# Patient Record
Sex: Female | Born: 1945 | Race: White | Hispanic: No | Smoking: Never smoker
Health system: Southern US, Community
[De-identification: ages and names within clinical notes are randomized; demographics above are authoritative.]

## PROBLEM LIST (undated history)

## (undated) DIAGNOSIS — E039 Hypothyroidism, unspecified: Secondary | ICD-10-CM

## (undated) DIAGNOSIS — M7121 Synovial cyst of popliteal space [Baker], right knee: Secondary | ICD-10-CM

## (undated) DIAGNOSIS — J329 Chronic sinusitis, unspecified: Secondary | ICD-10-CM

## (undated) DIAGNOSIS — E119 Type 2 diabetes mellitus without complications: Secondary | ICD-10-CM

## (undated) DIAGNOSIS — F32A Depression, unspecified: Secondary | ICD-10-CM

## (undated) DIAGNOSIS — R42 Dizziness and giddiness: Secondary | ICD-10-CM

## (undated) DIAGNOSIS — F329 Major depressive disorder, single episode, unspecified: Secondary | ICD-10-CM

## (undated) DIAGNOSIS — G4730 Sleep apnea, unspecified: Secondary | ICD-10-CM

## (undated) DIAGNOSIS — R519 Headache, unspecified: Secondary | ICD-10-CM

## (undated) DIAGNOSIS — M1990 Unspecified osteoarthritis, unspecified site: Secondary | ICD-10-CM

## (undated) DIAGNOSIS — D649 Anemia, unspecified: Secondary | ICD-10-CM

## (undated) DIAGNOSIS — I10 Essential (primary) hypertension: Secondary | ICD-10-CM

## (undated) DIAGNOSIS — R51 Headache: Secondary | ICD-10-CM

## (undated) DIAGNOSIS — E785 Hyperlipidemia, unspecified: Secondary | ICD-10-CM

## (undated) DIAGNOSIS — F419 Anxiety disorder, unspecified: Secondary | ICD-10-CM

## (undated) HISTORY — PX: ABDOMINAL HYSTERECTOMY: SHX81

## (undated) HISTORY — PX: BREAST BIOPSY: SHX20

## (undated) HISTORY — PX: CARDIAC CATHETERIZATION: SHX172

## (undated) HISTORY — PX: CHOLECYSTECTOMY: SHX55

---

## 2003-01-09 HISTORY — PX: JOINT REPLACEMENT: SHX530

## 2004-05-30 ENCOUNTER — Ambulatory Visit: Payer: Self-pay | Admitting: Family Medicine

## 2004-12-11 ENCOUNTER — Ambulatory Visit: Payer: Self-pay | Admitting: Family Medicine

## 2005-06-04 ENCOUNTER — Ambulatory Visit: Payer: Self-pay | Admitting: Unknown Physician Specialty

## 2005-10-06 ENCOUNTER — Emergency Department: Payer: Self-pay | Admitting: Emergency Medicine

## 2006-06-22 ENCOUNTER — Ambulatory Visit: Payer: Self-pay | Admitting: Family Medicine

## 2007-06-02 ENCOUNTER — Ambulatory Visit: Payer: Self-pay | Admitting: Family Medicine

## 2009-02-09 ENCOUNTER — Ambulatory Visit: Payer: Self-pay | Admitting: Family Medicine

## 2010-03-15 ENCOUNTER — Ambulatory Visit: Payer: Self-pay | Admitting: Family Medicine

## 2011-06-12 ENCOUNTER — Ambulatory Visit: Payer: Self-pay | Admitting: Family Medicine

## 2011-06-13 ENCOUNTER — Ambulatory Visit: Payer: Self-pay | Admitting: Family Medicine

## 2012-10-01 ENCOUNTER — Ambulatory Visit: Payer: Self-pay | Admitting: Family Medicine

## 2013-10-30 ENCOUNTER — Ambulatory Visit: Payer: Self-pay | Admitting: Family Medicine

## 2013-11-17 ENCOUNTER — Emergency Department: Payer: Self-pay | Admitting: Emergency Medicine

## 2013-11-17 LAB — CBC WITH DIFFERENTIAL/PLATELET
BASOS ABS: 0.1 10*3/uL (ref 0.0–0.1)
Basophil %: 0.6 %
Eosinophil #: 0 10*3/uL (ref 0.0–0.7)
Eosinophil %: 0.1 %
HCT: 35.4 % (ref 35.0–47.0)
HGB: 11.8 g/dL — AB (ref 12.0–16.0)
LYMPHS PCT: 12.8 %
Lymphocyte #: 1.3 10*3/uL (ref 1.0–3.6)
MCH: 30 pg (ref 26.0–34.0)
MCHC: 33.5 g/dL (ref 32.0–36.0)
MCV: 90 fL (ref 80–100)
MONOS PCT: 7.9 %
Monocyte #: 0.8 x10 3/mm (ref 0.2–0.9)
NEUTROS ABS: 7.9 10*3/uL — AB (ref 1.4–6.5)
NEUTROS PCT: 78.6 %
Platelet: 217 10*3/uL (ref 150–440)
RBC: 3.95 10*6/uL (ref 3.80–5.20)
RDW: 13.6 % (ref 11.5–14.5)
WBC: 10.1 10*3/uL (ref 3.6–11.0)

## 2013-11-17 LAB — URINALYSIS, COMPLETE
BILIRUBIN, UR: NEGATIVE
BLOOD: NEGATIVE
GLUCOSE, UR: NEGATIVE mg/dL (ref 0–75)
Ketone: NEGATIVE
Nitrite: NEGATIVE
PROTEIN: NEGATIVE
Ph: 5 (ref 4.5–8.0)
RBC,UR: 2 /HPF (ref 0–5)
Specific Gravity: 1.011 (ref 1.003–1.030)
WBC UR: 13 /HPF (ref 0–5)

## 2013-11-17 LAB — COMPREHENSIVE METABOLIC PANEL
Albumin: 3.7 g/dL (ref 3.4–5.0)
Alkaline Phosphatase: 131 U/L — ABNORMAL HIGH
Anion Gap: 9 (ref 7–16)
BUN: 14 mg/dL (ref 7–18)
Bilirubin,Total: 2.2 mg/dL — ABNORMAL HIGH (ref 0.2–1.0)
CHLORIDE: 99 mmol/L (ref 98–107)
Calcium, Total: 9.2 mg/dL (ref 8.5–10.1)
Co2: 27 mmol/L (ref 21–32)
Creatinine: 1.06 mg/dL (ref 0.60–1.30)
EGFR (African American): >60
EGFR (Non-African Amer.): 55 — ABNORMAL LOW
GLUCOSE: 134 mg/dL — AB (ref 65–99)
Osmolality: 273 (ref 275–301)
Potassium: 4.1 mmol/L (ref 3.5–5.1)
SGOT(AST): 20 U/L (ref 15–37)
SGPT (ALT): 34 U/L
Sodium: 135 mmol/L — ABNORMAL LOW (ref 136–145)
Total Protein: 8.2 g/dL (ref 6.4–8.2)

## 2013-11-17 LAB — TROPONIN I
Troponin-I: <0.02 ng/mL
Troponin-I: <0.02 ng/mL

## 2013-11-17 LAB — LIPASE, BLOOD: Lipase: 81 U/L (ref 73–393)

## 2014-01-21 ENCOUNTER — Ambulatory Visit: Payer: Self-pay | Admitting: Surgery

## 2014-01-21 LAB — HEPATIC FUNCTION PANEL A (ARMC)
ALT: 20 U/L
Albumin: 3.4 g/dL (ref 3.4–5.0)
Alkaline Phosphatase: 99 U/L
BILIRUBIN TOTAL: 0.6 mg/dL (ref 0.2–1.0)
Bilirubin, Direct: <0.1 mg/dL (ref 0.0–0.2)
SGOT(AST): 20 U/L (ref 15–37)
TOTAL PROTEIN: 6.9 g/dL (ref 6.4–8.2)

## 2014-01-21 LAB — BASIC METABOLIC PANEL
ANION GAP: 6 — AB (ref 7–16)
BUN: 9 mg/dL (ref 7–18)
CALCIUM: 8.3 mg/dL — AB (ref 8.5–10.1)
CO2: 31 mmol/L (ref 21–32)
Chloride: 101 mmol/L (ref 98–107)
Creatinine: 0.89 mg/dL (ref 0.60–1.30)
EGFR (African American): >60
EGFR (Non-African Amer.): >60
Glucose: 165 mg/dL — ABNORMAL HIGH (ref 65–99)
Osmolality: 278 (ref 275–301)
Potassium: 3.8 mmol/L (ref 3.5–5.1)
Sodium: 138 mmol/L (ref 136–145)

## 2014-01-22 ENCOUNTER — Ambulatory Visit: Payer: Self-pay | Admitting: Surgery

## 2014-01-22 LAB — CBC WITH DIFFERENTIAL/PLATELET
BASOS ABS: 0.1 10*3/uL (ref 0.0–0.1)
BASOS PCT: 0.8 %
EOS ABS: 0.2 10*3/uL (ref 0.0–0.7)
Eosinophil %: 2.3 %
HCT: 36.7 % (ref 35.0–47.0)
HGB: 12 g/dL (ref 12.0–16.0)
LYMPHS PCT: 18.8 %
Lymphocyte #: 1.6 10*3/uL (ref 1.0–3.6)
MCH: 29.2 pg (ref 26.0–34.0)
MCHC: 32.7 g/dL (ref 32.0–36.0)
MCV: 89 fL (ref 80–100)
MONOS PCT: 5.2 %
Monocyte #: 0.4 x10 3/mm (ref 0.2–0.9)
NEUTROS ABS: 6.2 10*3/uL (ref 1.4–6.5)
Neutrophil %: 72.9 %
Platelet: 197 10*3/uL (ref 150–440)
RBC: 4.11 10*6/uL (ref 3.80–5.20)
RDW: 13.7 % (ref 11.5–14.5)
WBC: 8.6 10*3/uL (ref 3.6–11.0)

## 2014-01-28 ENCOUNTER — Ambulatory Visit: Payer: Self-pay | Admitting: Surgery

## 2014-05-01 NOTE — Consult Note (Signed)
PATIENT NAME:  Kelly Wells, Kelly Wells MR#:  [id number] DATE OF BIRTH:  Dec 16, 1945  DATE OF CONSULTATION:  11/17/2013  REFERRING PHYSICIAN:   CONSULTING PHYSICIAN:  Rendy Lazard A. Marina Gravel, MD  REASON FOR CONSULTATION: Gallstones.   HISTORY OF PRESENT ILLNESS:  A 69 year old otherwise healthy white female presents to the Emergency Room in referral by her PCP with some vague chest pain. This chest pain has been going on for 2 weeks. She has had 2 episodes of vomiting. The first one was 2 weeks ago and the last one was Saturday. She has had generalized malaise, myalgias, right arm numbness, chest pain, some mild diffuse abdominal pain but no difficulty eating and no intolerance of any type of certain foods making her sick. She denies any fevers at home; however, in the Emergency Room she had a low-grade temperature. She has had no jaundice. There have been no sick contacts. She is a widow. She is employed at [employer]. There has been no one sick at [employer].  No fatty food intolerance is noted. Workup in the Emergency Room demonstrates mildly elevated bilirubin of 2.2 and an ultrasound demonstrating gallstones seen in the neck of the gallbladder with the bile duct measuring 1 cm. Surgical services as such were asked to consult.   ALLERGIES: KEFLEX.   MEDICATIONS: Byetta prefilled pen, fluoxetine and metformin.   PAST MEDICAL HISTORY: Significant for diabetes.   PAST SURGICAL HISTORY: The patient has had a previous hysterectomy..   SOCIAL HISTORY: She does not smoke, does not drink, is employed, recently widowed 1 year ago.   PHYSICAL EXAMINATION:  VITAL SIGNS: Temperature is 100.2, pulse of 90, respiratory rate of 18, blood pressure is 132/84, room air saturation is 99%. She is 5 foot 6 inches. BMI is 28.3.  GENERAL: The patient is in no obvious distress. She is alert and oriented. She is anicteric.  LUNGS: Clear.  HEART: Regular rate and rhythm. No tachycardia.  ABDOMEN: Soft and nontender. I  could  appreciate no evidence of a Murphy sign.  EXTREMITIES: Warm and well perfused.  NEUROLOGIC AND PSYCHIATRIC: Unremarkable.   LABORATORY VALUES: Urinalysis is negative. Lipase is 81. Electrolytes are unremarkable. Glucose is 134. White count is 10, hemoglobin 11.8, platelet count 217,000. Troponins are negative. EKG is unremarkable. Chest x-ray unremarkable. Ultrasound demonstrates bile duct 1 cm. Two gallstones are seen in the gallbladder neck. No gallbladder wall thickening. No sonographic Percell Miller sign is noted. No focal lesions seen in the liver. Probable fatty infiltration of the liver.   IMPRESSION: The patient has gallstones and dilated bile duct. At present, it is unclear to me whether her abdominal pain is secondary to gallstone disease. She has a litany of other complaints a lot of which sound like a viral syndrome. At any rate, her bile duct does need to be evaluated as an outpatient. I recommended outpatient follow up in our office or in her primary care physician's office. She is to return if any further issues. The patient was somewhat hesitant to be admitted because of animals at home and no one to take care of them.      ____________________________ Jeannette How. Marina Gravel, MD mab:AT D: 11/17/2013 89:37:34 ET T: 11/18/2013 05:58:31 ET JOB#: [id number]  cc: Elta Guadeloupe A. Marina Gravel, MD, <Dictator> Youlanda Roys. Lovie Macadamia, MD Hortencia Conradi MD ELECTRONICALLY SIGNED 11/18/2013 23:27

## 2014-05-01 NOTE — Consult Note (Signed)
Brief Consult Note: Diagnosis: gallstones,.   Patient was seen by consultant.   Discussed with Attending MD.   Comments: I see no acute need for surgical intervention.  Electronic Signatures: Sherri Rad (MD)  (Signed [phone] 23:39)  Authored: Brief Consult Note   Last Updated: 10-Nov-15 23:39 by Sherri Rad (MD)

## 2014-05-03 LAB — SURGICAL PATHOLOGY

## 2014-05-09 NOTE — Op Note (Signed)
PATIENT NAME:  Kelly Wells, Kelly Wells MR#:  [id number] DATE OF BIRTH:  09/23/1945  DATE OF PROCEDURE:  01/28/2014  PREOPERATIVE DIAGNOSIS: Symptomatic cholelithiasis, history of likely choledocholithiasis.   POSTOPERATIVE DIAGNOSIS:  Symptomatic cholelithiasis, history of likely choledocholithiasis.   PROCEDURE PERFORMED: Laparoscopic cholecystectomy.   ANESTHESIA: General.   ESTIMATED BLOOD LOSS: 10 mL.   PROCEDURE PERFORMED: Laparoscopic cholecystectomy with cholangiogram.   SPECIMENS: Gallbladder.  INDICATION FOR SURGERY: [name] is a pleasant 69 year old female that presents with recurrent epigastric pain and with fatty food intake as well as a history of elevated bilirubin. Her bilirubin had normalized but she was brought to the Operating Room suite for a cholecystectomy with cholangiogram.   DETAILS OF PROCEDURE:  After informed consent was obtained she was induced. Endotracheal tube was placed, general anesthesia was administered. Her abdomen was prepped and draped in standard surgical fashion. A timeout was then performed correctly identifying the patient name, operative site and procedure to be formed. A supraumbilical incision was made and it was deepened down to the fascia. The fascia was incised. The peritoneum was entered. Two stay sutures were placed through the fasciotomy. Hassan trocar was placed in the abdomen and the abdomen was insufflated. An 11 mm epigastric and 25 mm right subcostal trocars were placed at the midclavicular and anterior axillary line. The gallbladder was then lifted up over the dome of the liver. The cystic artery and cystic duct were dissected out. Critical view was obtained. The cystic artery was clipped 3 times and ligated. The cystic duct was clipped and a ductotomy was made. A cholangiogram catheter was placed. Cholangiogram was performed, which showed no obvious retained stone but it did show good distal filling with contrast into the duodenum as well as with  some pressure, adequate proximal filling.  The cholangiogram catheter was taken out and the duct was then clipped. The gallbladder was then taken off the gallbladder fossa and brought out with an Endo Catch bag. Hemostasis was obtained. The abdomen was irrigated. After hemostasis was satisfactory all trocars were removed under direct visualization. The supraumbilical fascia was closed with figure-of-eight 0 Vicryl. Skin sites were closed with 4 Monocryl deep dermal sutures. Steri-Strips, Telfa gauze and Tegaderm were used to complete the dressing. The patient was then awoken, extubated and brought to the postanesthesia care unit. There were no immediate complications. Needle, sponge, and instrument counts were correct at the end of the procedure.    ____________________________ Glena Norfolk. Shiara Mcgough, MD cal:at D: 01/28/2014 10:31:39 ET T: 01/28/2014 12:55:08 ET JOB#: [id number]  cc: Harrell Gave A. Sharrie Self, MD, <Dictator> Floyde Parkins MD ELECTRONICALLY SIGNED 02/09/2014 11:13

## 2015-07-01 ENCOUNTER — Encounter: Payer: Self-pay | Admitting: *Deleted

## 2015-07-04 ENCOUNTER — Ambulatory Visit
Admission: RE | Admit: 2015-07-04 | Discharge: 2015-07-04 | Disposition: A | Discharge status: Home / Self Care | Payer: Medicare Other | Source: Ambulatory Visit | Attending: Unknown Physician Specialty | Admitting: Unknown Physician Specialty

## 2015-07-04 ENCOUNTER — Encounter
Admission: RE | Disposition: A | Discharge status: Home / Self Care | Payer: Self-pay | Source: Ambulatory Visit | Attending: Unknown Physician Specialty

## 2015-07-04 ENCOUNTER — Ambulatory Visit: Payer: Medicare Other | Admitting: Anesthesiology

## 2015-07-04 ENCOUNTER — Encounter: Payer: Self-pay | Admitting: Anesthesiology

## 2015-07-04 DIAGNOSIS — D123 Benign neoplasm of transverse colon: Secondary | ICD-10-CM | POA: Insufficient documentation

## 2015-07-04 DIAGNOSIS — G4730 Sleep apnea, unspecified: Secondary | ICD-10-CM | POA: Insufficient documentation

## 2015-07-04 DIAGNOSIS — E119 Type 2 diabetes mellitus without complications: Secondary | ICD-10-CM | POA: Diagnosis not present

## 2015-07-04 DIAGNOSIS — K648 Other hemorrhoids: Secondary | ICD-10-CM | POA: Insufficient documentation

## 2015-07-04 DIAGNOSIS — K6289 Other specified diseases of anus and rectum: Secondary | ICD-10-CM | POA: Diagnosis not present

## 2015-07-04 DIAGNOSIS — D122 Benign neoplasm of ascending colon: Secondary | ICD-10-CM | POA: Insufficient documentation

## 2015-07-04 DIAGNOSIS — Z7951 Long term (current) use of inhaled steroids: Secondary | ICD-10-CM | POA: Insufficient documentation

## 2015-07-04 DIAGNOSIS — Z9660 Presence of unspecified orthopedic joint implant: Secondary | ICD-10-CM | POA: Insufficient documentation

## 2015-07-04 DIAGNOSIS — Z791 Long term (current) use of non-steroidal anti-inflammatories (NSAID): Secondary | ICD-10-CM | POA: Diagnosis not present

## 2015-07-04 DIAGNOSIS — Z1211 Encounter for screening for malignant neoplasm of colon: Secondary | ICD-10-CM | POA: Insufficient documentation

## 2015-07-04 DIAGNOSIS — I10 Essential (primary) hypertension: Secondary | ICD-10-CM | POA: Diagnosis not present

## 2015-07-04 DIAGNOSIS — Z79899 Other long term (current) drug therapy: Secondary | ICD-10-CM | POA: Insufficient documentation

## 2015-07-04 DIAGNOSIS — F329 Major depressive disorder, single episode, unspecified: Secondary | ICD-10-CM | POA: Insufficient documentation

## 2015-07-04 DIAGNOSIS — F419 Anxiety disorder, unspecified: Secondary | ICD-10-CM | POA: Insufficient documentation

## 2015-07-04 DIAGNOSIS — M1990 Unspecified osteoarthritis, unspecified site: Secondary | ICD-10-CM | POA: Diagnosis not present

## 2015-07-04 DIAGNOSIS — Z7984 Long term (current) use of oral hypoglycemic drugs: Secondary | ICD-10-CM | POA: Insufficient documentation

## 2015-07-04 HISTORY — DX: Sleep apnea, unspecified: G47.30

## 2015-07-04 HISTORY — DX: Anxiety disorder, unspecified: F41.9

## 2015-07-04 HISTORY — DX: Type 2 diabetes mellitus without complications: E11.9

## 2015-07-04 HISTORY — DX: Depression, unspecified: F32.A

## 2015-07-04 HISTORY — DX: Hyperlipidemia, unspecified: E78.5

## 2015-07-04 HISTORY — DX: Essential (primary) hypertension: I10

## 2015-07-04 HISTORY — PX: COLONOSCOPY WITH PROPOFOL: SHX5780

## 2015-07-04 HISTORY — DX: Major depressive disorder, single episode, unspecified: F32.9

## 2015-07-04 HISTORY — DX: Unspecified osteoarthritis, unspecified site: M19.90

## 2015-07-04 HISTORY — DX: Anemia, unspecified: D64.9

## 2015-07-04 LAB — GLUCOSE, CAPILLARY: Glucose-Capillary: 184 mg/dL — ABNORMAL HIGH (ref 65–99)

## 2015-07-04 SURGERY — COLONOSCOPY WITH PROPOFOL
Anesthesia: General

## 2015-07-04 MED ORDER — FLEET ENEMA 7-19 GM/118ML RE ENEM: 1.0000 | ENEMA | Freq: Once | RECTAL | Status: DC

## 2015-07-04 MED ORDER — PROPOFOL 500 MG/50ML IV EMUL
INTRAVENOUS | Status: DC | PRN
  Administered 2015-07-04: 150 ug/kg/min via INTRAVENOUS

## 2015-07-04 MED ORDER — FENTANYL CITRATE (PF) 100 MCG/2ML IJ SOLN: 25.0000 ug | INTRAMUSCULAR | Status: DC | PRN

## 2015-07-04 MED ORDER — LIDOCAINE HCL (CARDIAC) 20 MG/ML IV SOLN
INTRAVENOUS | Status: DC | PRN
  Administered 2015-07-04: 50 mg via INTRAVENOUS

## 2015-07-04 MED ORDER — PROPOFOL 10 MG/ML IV BOLUS
INTRAVENOUS | Status: DC | PRN
  Administered 2015-07-04: 20 mg via INTRAVENOUS
  Administered 2015-07-04: 30 mg via INTRAVENOUS
  Administered 2015-07-04: 20 mg via INTRAVENOUS
  Administered 2015-07-04: 30 mg via INTRAVENOUS

## 2015-07-04 MED ORDER — MIDAZOLAM HCL 2 MG/2ML IJ SOLN
INTRAMUSCULAR | Status: DC | PRN
  Administered 2015-07-04: 2 mg via INTRAVENOUS

## 2015-07-04 MED ORDER — SODIUM CHLORIDE 0.9 % IV SOLN
INTRAVENOUS | Status: DC
  Administered 2015-07-04: 1000 mL via INTRAVENOUS

## 2015-07-04 MED ORDER — SODIUM CHLORIDE 0.9 % IV SOLN: INTRAVENOUS | Status: DC

## 2015-07-04 MED ORDER — ONDANSETRON HCL 4 MG/2ML IJ SOLN: 4.0000 mg | Freq: Once | INTRAMUSCULAR | Status: DC | PRN

## 2015-07-04 NOTE — Transfer of Care (Signed)
Immediate Anesthesia Transfer of Care Note  Patient: Kelly Wells  Procedure(s) Performed: Procedure(s): COLONOSCOPY WITH PROPOFOL (N/A)  Patient Location: PACU  Anesthesia Type:MAC  Level of Consciousness: awake  Airway & Oxygen Therapy: Patient Spontanous Breathing  Post-op Assessment: Report given to RN  Post vital signs: stable  Last Vitals:  Filed Vitals:   07/04/15 0702 07/04/15 0806  BP: 146/88   Pulse: 86 72  Temp: 35.7 C 36.1 C  Resp: 16 16    Last Pain:  Filed Vitals:   07/04/15 0808  PainSc: 4          Complications: No apparent anesthesia complications

## 2015-07-04 NOTE — Op Note (Signed)
[facility] Patient Name: Kelly Wells Procedure Date: 07/04/2015 7:23 AM MRN: [id number] Account #: [id number] Date of Birth: Jan 06, 1946 Admit Type: Outpatient Age: 70 Room: [facility] ENDO ROOM 1 Gender: Female Note Status: Finalized Procedure:            Colonoscopy Indications:          Screening for colorectal malignant neoplasm Providers:            Manya Silvas, MD Referring MD:         Youlanda Roys. Lovie Macadamia, MD (Referring MD) Medicines:            Propofol per Anesthesia Complications:        No immediate complications. Procedure:            Pre-Anesthesia Assessment:                       - After reviewing the risks and benefits, the patient                        was deemed in satisfactory condition to undergo the                        procedure.                       After obtaining informed consent, the colonoscope was                        passed under direct vision. Throughout the procedure,                        the patient's blood pressure, pulse, and oxygen                        saturations were monitored continuously. The                        Colonoscope was introduced through the anus and                        advanced to the the cecum, identified by appendiceal                        orifice and ileocecal valve. The colonoscopy was                        performed without difficulty. The patient tolerated the                        procedure well. The quality of the bowel preparation                        was excellent. Findings:      A diminutive polyp was found in the ascending colon. The polyp was       sessile. The polyp was removed with a jumbo cold forceps. Resection and       retrieval were complete.      A small polyp was found in the transverse colon. The polyp was sessile.       The polyp was removed with a hot snare. Resection and retrieval were  complete.      One diminutive nodule was found at the anus.      Internal hemorrhoids were found during endoscopy. The hemorrhoids were       small and Grade I (internal hemorrhoids that do not prolapse).      The exam was otherwise without abnormality. Impression:           - One diminutive polyp in the ascending colon, removed                        with a jumbo cold forceps. Resected and retrieved.                       - One small polyp in the transverse colon, removed with                        a hot snare. Resected and retrieved.                       - Nodule at the anus.                       - Internal hemorrhoids.                       - The examination was otherwise normal. Recommendation:       - Await pathology results. Manya Silvas, MD 07/04/2015 8:06:19 AM This report has been signed electronically. Number of Addenda: 0 Note Initiated On: 07/04/2015 7:23 AM Scope Withdrawal Time: 0 hours 11 minutes 38 seconds  Total Procedure Duration: 0 hours 20 minutes 9 seconds       [facility]

## 2015-07-04 NOTE — H&P (Signed)
Primary Care Physician:  Juluis Pitch, MD Primary Gastroenterologist:  Dr. Vira Agar  Pre-Procedure History & Physical: HPI:  Kelly Wells is a 70 y.o. female is here for an colonoscopy.   Past Medical History  Diagnosis Date  . Anemia   . Anxiety   . Arthritis   . Depression   . Diabetes mellitus without complication ([place])   . Hyperlipidemia   . Hypertension   . Sleep apnea     Past Surgical History  Procedure Laterality Date  . Abdominal hysterectomy    . Joint replacement Left 2005  . Cholecystectomy      Prior to Admission medications   Medication Sig Start Date End Date Taking? Authorizing Provider  acetaminophen (TYLENOL) 650 MG CR tablet Take 650 mg by mouth every 8 (eight) hours as needed for pain.   Yes Historical Provider, MD  amLODipine (NORVASC) 5 MG tablet Take 5 mg by mouth daily.   Yes Historical Provider, MD  clonazePAM (KLONOPIN) 0.5 MG tablet Take 0.5 mg by mouth 2 (two) times daily as needed for anxiety.   Yes Historical Provider, MD  cyclobenzaprine (FLEXERIL) 10 MG tablet Take 10 mg by mouth 3 (three) times daily as needed for muscle spasms.   Yes Historical Provider, MD  Exenatide ER (BYDUREON) 2 MG PEN Inject into the skin.   Yes Historical Provider, MD  FLUoxetine (PROZAC) 40 MG capsule Take 40 mg by mouth daily.   Yes Historical Provider, MD  fluticasone (FLONASE) 50 MCG/ACT nasal spray Place into both nostrils daily.   Yes Historical Provider, MD  levothyroxine (SYNTHROID, LEVOTHROID) 88 MCG tablet Take 88 mcg by mouth daily before breakfast.   Yes Historical Provider, MD  meloxicam (MOBIC) 15 MG tablet Take 15 mg by mouth daily.   Yes Historical Provider, MD  metFORMIN (GLUCOPHAGE) 1000 MG tablet Take 1,000 mg by mouth 2 (two) times daily with a meal.   Yes Historical Provider, MD  omeprazole (PRILOSEC) 20 MG capsule Take 20 mg by mouth daily.   Yes Historical Provider, MD    Allergies as of 06/14/2015  . (Not on File)    History reviewed.  No pertinent family history.  Social History   Social History  . Marital Status: Married    Spouse Name: N/A  . Number of Children: N/A  . Years of Education: N/A   Occupational History  . Not on file.   Social History Main Topics  . Smoking status: Not on file  . Smokeless tobacco: Not on file  . Alcohol Use: Not on file  . Drug Use: Not on file  . Sexual Activity: Not on file   Other Topics Concern  . Not on file   Social History Narrative    Review of Systems: See HPI, otherwise negative ROS  Physical Exam: BP 146/88 mmHg  Pulse 86  Temp(Src) 96.3 F (35.7 C) (Tympanic)  Resp 16  Ht 5\' 4"  (1.626 m)  Wt 83.008 kg (183 lb)  BMI 31.40 kg/m2  SpO2 99% General:   Alert,  pleasant and cooperative in NAD Head:  Normocephalic and atraumatic. Neck:  Supple; no masses or thyromegaly. Lungs:  Clear throughout to auscultation.    Heart:  Regular rate and rhythm. Abdomen:  Soft, nontender and nondistended. Normal bowel sounds, without guarding, and without rebound.   Neurologic:  Alert and  oriented x4;  grossly normal neurologically.  Impression/Plan: Kelly Wells is here for an colonoscopy to be performed for screening colonoscopy  Risks,  benefits, limitations, and alternatives regarding  colonoscopy have been reviewed with the patient.  Questions have been answered.  All parties agreeable.   Gaylyn Cheers, MD  07/04/2015, 7:28 AM

## 2015-07-04 NOTE — Anesthesia Preprocedure Evaluation (Signed)
Anesthesia Evaluation  Patient identified by MRN, date of birth, ID band Patient awake    Reviewed: Allergy & Precautions, NPO status   Airway Mallampati: III  TM Distance: <3 FB Neck ROM: Full    Dental  (+) Caps   Pulmonary sleep apnea and Continuous Positive Airway Pressure Ventilation ,    Pulmonary exam normal        Cardiovascular hypertension, Pt. on medications Normal cardiovascular exam     Neuro/Psych Anxiety Depression    GI/Hepatic negative GI ROS, Neg liver ROS,   Endo/Other  diabetes  Renal/GU negative Renal ROS  negative genitourinary   Musculoskeletal  (+) Arthritis , Osteoarthritis,    Abdominal Some lower abdominal tenderness  Peds negative pediatric ROS (+)  Hematology  (+) anemia ,   Anesthesia Other Findings   Reproductive/Obstetrics                             Anesthesia Physical Anesthesia Plan  ASA: III  Anesthesia Plan: General   Post-op Pain Management:    Induction: Intravenous  Airway Management Planned: Nasal Cannula  Additional Equipment:   Intra-op Plan:   Post-operative Plan:   Informed Consent: I have reviewed the patients History and Physical, chart, labs and discussed the procedure including the risks, benefits and alternatives for the proposed anesthesia with the patient or authorized representative who has indicated his/her understanding and acceptance.   Dental advisory given  Plan Discussed with: CRNA and Surgeon  Anesthesia Plan Comments:         Anesthesia Quick Evaluation

## 2015-07-05 ENCOUNTER — Encounter: Payer: Self-pay | Admitting: Unknown Physician Specialty

## 2015-07-05 LAB — SURGICAL PATHOLOGY

## 2015-07-05 NOTE — Anesthesia Postprocedure Evaluation (Signed)
Anesthesia Post Note  Patient: Kelly Wells  Procedure(s) Performed: Procedure(s) (LRB): COLONOSCOPY WITH PROPOFOL (N/A)  Patient location during evaluation: PACU Anesthesia Type: General Level of consciousness: awake and alert and oriented Pain management: pain level controlled Vital Signs Assessment: post-procedure vital signs reviewed and stable Respiratory status: spontaneous breathing Cardiovascular status: blood pressure returned to baseline Postop Assessment: no headache Anesthetic complications: no    Last Vitals:  Filed Vitals:   07/04/15 0836 07/04/15 0846  BP: 141/79 141/80  Pulse: 65 61  Temp:    Resp: 15 17    Last Pain:  Filed Vitals:   07/05/15 0821  PainSc: 0-No pain                 Barbi Kumagai

## 2015-11-15 ENCOUNTER — Other Ambulatory Visit: Payer: Self-pay | Admitting: Family Medicine

## 2015-11-15 DIAGNOSIS — Z1231 Encounter for screening mammogram for malignant neoplasm of breast: Secondary | ICD-10-CM

## 2015-12-26 ENCOUNTER — Ambulatory Visit
Admission: RE | Admit: 2015-12-26 | Discharge: 2015-12-26 | Disposition: A | Discharge status: Home / Self Care | Payer: Medicare Other | Source: Ambulatory Visit | Attending: Family Medicine | Admitting: Family Medicine

## 2015-12-26 DIAGNOSIS — Z1231 Encounter for screening mammogram for malignant neoplasm of breast: Secondary | ICD-10-CM | POA: Diagnosis present

## 2017-03-06 ENCOUNTER — Other Ambulatory Visit: Payer: Self-pay

## 2017-03-07 NOTE — Discharge Instructions (Signed)
[facility] °[facility] °ENDOSCOPIC SINUS SURGERY °[facility] ° °What is Functional Endoscopic Sinus Surgery? ° The Surgery involves making the natural openings of the sinuses larger by removing the bony partitions that separate the sinuses from the nasal cavity.  The natural sinus lining is preserved as much as possible to allow the sinuses to resume normal function after the surgery.  In some patients nasal polyps (excessively swollen lining of the sinuses) may be removed to relieve obstruction of the sinus openings.  The surgery is performed through the nose using lighted scopes, which eliminates the need for incisions on the face.  A septoplasty is a different procedure which is sometimes performed with sinus surgery.  It involves straightening the boy partition that separates the two sides of your nose.  A crooked or deviated septum may need repair if is obstructing the sinuses or nasal airflow.  Turbinate reduction is also often performed during sinus surgery.  The turbinates are bony proturberances from the side walls of the nose which swell and can obstruct the nose in patients with sinus and allergy problems.  Their size can be surgically reduced to help relieve nasal obstruction. ° °What Can Sinus Surgery Do For Me? ° Sinus surgery can reduce the frequency of sinus infections requiring antibiotic treatment.  This can provide improvement in nasal congestion, post-nasal drainage, facial pressure and nasal obstruction.  Surgery will NOT prevent you from ever having an infection again, so it usually only for patients who get infections 4 or more times yearly requiring antibiotics, or for infections that do not clear with antibiotics.  It will not cure nasal allergies, so patients with allergies may still require medication to treat their allergies after surgery. Surgery may improve headaches related to sinusitis, however, some people will continue to  require medication to control sinus headaches related to allergies.  Surgery will do nothing for other forms of headache (migraine, tension or cluster). ° °What Are the Risks of Endoscopic Sinus Surgery? ° Current techniques allow surgery to be performed safely with little risk, however, there are rare complications that patients should be aware of.  Because the sinuses are located around the eyes, there is risk of eye injury, including blindness, though again, this would be quite rare. This is usually a result of bleeding behind the eye during surgery, which puts the vision oat risk, though there are treatments to protect the vision and prevent permanent disrupted by surgery causing a leak of the spinal fluid that surrounds the brain.  More serious complications would include bleeding inside the brain cavity or damage to the brain.  Again, all of these complications are uncommon, and spinal fluid leaks can be safely managed surgically if they occur.  The most common complication of sinus surgery is bleeding from the nose, which may require packing or cauterization of the nose.  Continued sinus have polyps may experience recurrence of the polyps requiring revision surgery.  Alterations of sense of smell or injury to the tear ducts are also rare complications.  ° °What is the Surgery Like, and what is the Recovery? ° The Surgery usually takes a couple of hours to perform, and is usually performed under a general anesthetic (completely asleep).  Patients are usually discharged home after a couple of hours.  Sometimes during surgery it is necessary to pack the nose to control bleeding, and the packing is left in place for 24 - 48 hours, and removed by your surgeon.    If a septoplasty was performed during the procedure, there is often a splint placed which must be removed after 5-7 days.   °Discomfort: Pain is usually mild to moderate, and can be controlled by prescription pain medication or acetaminophen (Tylenol).   Aspirin, Ibuprofen (Advil, Motrin), or Naprosyn (Aleve) should be avoided, as they can cause increased bleeding.  Most patients feel sinus pressure like they have a bad head cold for several days.  Sleeping with your head elevated can help reduce swelling and facial pressure, as can ice packs over the face.  A humidifier may be helpful to keep the mucous and blood from drying in the nose.  ° °Diet: There are no specific diet restrictions, however, you should generally start with clear liquids and a light diet of bland foods because the anesthetic can cause some nausea.  Advance your diet depending on how your stomach feels.  Taking your pain medication with food will often help reduce stomach upset which pain medications can cause. ° °Nasal Saline Irrigation: It is important to remove blood clots and dried mucous from the nose as it is healing.  This is done by having you irrigate the nose at least 3 - 4 times daily with a salt water solution.  We recommend using NeilMed Sinus Rinse (available at the drug store).  Fill the squeeze bottle with the solution, bend over a sink, and insert the tip of the squeeze bottle into the nose ½ of an inch.  Point the tip of the squeeze bottle towards the inside corner of the eye on the same side your irrigating.  Squeeze the bottle and gently irrigate the nose.  If you bend forward as you do this, most of the fluid will flow back out of the nose, instead of down your throat.   The solution should be warm, near body temperature, when you irrigate.   Each time you irrigate, you should use a full squeeze bottle.  ° °Note that if you are instructed to use Nasal Steroid Sprays at any time after your surgery, irrigate with saline BEFORE using the steroid spray, so you do not wash it all out of the nose. °Another product, Nasal Saline Gel (such as AYR Nasal Saline Gel) can be applied in each nostril 3 - 4 times daily to moisture the nose and reduce scabbing or crusting. ° °Bleeding:   Bloody drainage from the nose can be expected for several days, and patients are instructed to irrigate their nose frequently with salt water to help remove mucous and blood clots.  The drainage may be dark red or brown, though some fresh blood may be seen intermittently, especially after irrigation.  Do not blow you nose, as bleeding may occur. If you must sneeze, keep your mouth open to allow air to escape through your mouth. ° °If heavy bleeding occurs: Irrigate the nose with saline to rinse out clots, then spray the nose 3 - 4 times with Afrin Nasal Decongestant Spray.  The spray will constrict the blood vessels to slow bleeding.  Pinch the lower half of your nose shut to apply pressure, and lay down with your head elevated.  Ice packs over the nose may help as well. If bleeding persists despite these measures, you should notify your doctor.  Do not use the Afrin routinely to control nasal congestion after surgery, as it can result in worsening congestion and may affect healing.  ° ° ° °Activity: Return to work varies among patients. Most patients will be   out of work at least 5 - 7 days to recover.  Patient may return to work after they are off of narcotic pain medication, and feeling well enough to perform the functions of their job.  Patients must avoid heavy lifting (over 10 pounds) or strenuous physical for 2 weeks after surgery, so your employer may need to assign you to light duty, or keep you out of work longer if light duty is not possible.  NOTE: you should not drive, operate dangerous machinery, do any mentally demanding tasks or make any important legal or financial decisions while on narcotic pain medication and recovering from the general anesthetic.  °  °Call Your Doctor Immediately if You Have Any of the Following: °1. Bleeding that you cannot control with the above measures °2. Loss of vision, double vision, bulging of the eye or black eyes. °3. Fever over 101 degrees °4. Neck stiffness with  severe headache, fever, nausea and change in mental state. °You are always encourage to call anytime with concerns, however, please call with requests for pain medication refills during office hours. ° °Office Endoscopy: During follow-up visits your doctor will remove any packing or splints that may have been placed and evaluate and clean your sinuses endoscopically.  Topical anesthetic will be used to make this as comfortable as possible, though you may want to take your pain medication prior to the visit.  How often this will need to be done varies from patient to patient.  After complete recovery from the surgery, you may need follow-up endoscopy from time to time, particularly if there is concern of recurrent infection or nasal polyps. ° ° °General Anesthesia, Adult, Care After °These instructions provide you with information about caring for yourself after your procedure. Your health care provider may also give you more specific instructions. Your treatment has been planned according to current medical practices, but problems sometimes occur. Call your health care provider if you have any problems or questions after your procedure. °What can I expect after the procedure? °After the procedure, it is common to have: °· Vomiting. °· A sore throat. °· Mental slowness. ° °It is common to feel: °· Nauseous. °· Cold or shivery. °· Sleepy. °· Tired. °· Sore or achy, even in parts of your body where you did not have surgery. ° °Follow these instructions at home: °For at least 24 hours after the procedure: °· Do not: °? Participate in activities where you could fall or become injured. °? Drive. °? Use heavy machinery. °? Drink alcohol. °? Take sleeping pills or medicines that cause drowsiness. °? Make important decisions or sign legal documents. °? Take care of children on your own. °· Rest. °Eating and drinking °· If you vomit, drink water, juice, or soup when you can drink without vomiting. °· Drink enough fluid to  keep your urine clear or pale yellow. °· Make sure you have little or no nausea before eating solid foods. °· Follow the diet recommended by your health care provider. °General instructions °· Have a responsible adult stay with you until you are awake and alert. °· Return to your normal activities as told by your health care provider. Ask your health care provider what activities are safe for you. °· Take over-the-counter and prescription medicines only as told by your health care provider. °· If you smoke, do not smoke without supervision. °· Keep all follow-up visits as told by your health care provider. This is important. °Contact a health care provider if: °· You   continue to have nausea or vomiting at home, and medicines are not helpful. °· You cannot drink fluids or start eating again. °· You cannot urinate after 8-12 hours. °· You develop a skin rash. °· You have fever. °· You have increasing redness at the site of your procedure. °Get help right away if: °· You have difficulty breathing. °· You have chest pain. °· You have unexpected bleeding. °· You feel that you are having a life-threatening or urgent problem. °This information is not intended to replace advice given to you by your health care provider. Make sure you discuss any questions you have with your health care provider. °Document Released: 04/02/2000 Document Revised: 05/30/2015 Document Reviewed: 12/09/2014 °Elsevier Interactive Patient Education © 2018 [employer] ° °

## 2017-03-13 ENCOUNTER — Encounter
Admission: RE | Disposition: A | Discharge status: Home / Self Care | Payer: Self-pay | Source: Ambulatory Visit | Attending: Otolaryngology

## 2017-03-13 ENCOUNTER — Ambulatory Visit: Payer: Medicare Other | Admitting: Anesthesiology

## 2017-03-13 ENCOUNTER — Ambulatory Visit
Admission: RE | Admit: 2017-03-13 | Discharge: 2017-03-13 | Disposition: A | Discharge status: Home / Self Care | Payer: Medicare Other | Source: Ambulatory Visit | Attending: Otolaryngology | Admitting: Otolaryngology

## 2017-03-13 DIAGNOSIS — F329 Major depressive disorder, single episode, unspecified: Secondary | ICD-10-CM | POA: Diagnosis not present

## 2017-03-13 DIAGNOSIS — Z7951 Long term (current) use of inhaled steroids: Secondary | ICD-10-CM | POA: Diagnosis not present

## 2017-03-13 DIAGNOSIS — Z9660 Presence of unspecified orthopedic joint implant: Secondary | ICD-10-CM | POA: Insufficient documentation

## 2017-03-13 DIAGNOSIS — G4730 Sleep apnea, unspecified: Secondary | ICD-10-CM | POA: Insufficient documentation

## 2017-03-13 DIAGNOSIS — J329 Chronic sinusitis, unspecified: Secondary | ICD-10-CM | POA: Insufficient documentation

## 2017-03-13 DIAGNOSIS — B479 Mycetoma, unspecified: Secondary | ICD-10-CM | POA: Diagnosis not present

## 2017-03-13 DIAGNOSIS — F419 Anxiety disorder, unspecified: Secondary | ICD-10-CM | POA: Insufficient documentation

## 2017-03-13 DIAGNOSIS — Z7984 Long term (current) use of oral hypoglycemic drugs: Secondary | ICD-10-CM | POA: Insufficient documentation

## 2017-03-13 DIAGNOSIS — Z79899 Other long term (current) drug therapy: Secondary | ICD-10-CM | POA: Diagnosis not present

## 2017-03-13 DIAGNOSIS — E119 Type 2 diabetes mellitus without complications: Secondary | ICD-10-CM | POA: Insufficient documentation

## 2017-03-13 DIAGNOSIS — I10 Essential (primary) hypertension: Secondary | ICD-10-CM | POA: Insufficient documentation

## 2017-03-13 HISTORY — PX: ETHMOIDECTOMY: SHX5197

## 2017-03-13 HISTORY — DX: Chronic sinusitis, unspecified: J32.9

## 2017-03-13 HISTORY — DX: Headache: R51

## 2017-03-13 HISTORY — DX: Hypothyroidism, unspecified: E03.9

## 2017-03-13 HISTORY — PX: IMAGE GUIDED SINUS SURGERY: SHX6570

## 2017-03-13 HISTORY — PX: MAXILLARY ANTROSTOMY: SHX2003

## 2017-03-13 HISTORY — DX: Headache, unspecified: R51.9

## 2017-03-13 LAB — GLUCOSE, CAPILLARY
Glucose-Capillary: 160 mg/dL — ABNORMAL HIGH (ref 65–99)
Glucose-Capillary: 195 mg/dL — ABNORMAL HIGH (ref 65–99)

## 2017-03-13 SURGERY — SINUS SURGERY, WITH IMAGING GUIDANCE
Anesthesia: General | Site: Nose | Laterality: Left | Wound class: Clean Contaminated

## 2017-03-13 MED ORDER — FENTANYL CITRATE (PF) 100 MCG/2ML IJ SOLN
INTRAMUSCULAR | Status: DC | PRN
  Administered 2017-03-13: 50 ug via INTRAVENOUS

## 2017-03-13 MED ORDER — DEXAMETHASONE SODIUM PHOSPHATE 4 MG/ML IJ SOLN
INTRAMUSCULAR | Status: DC | PRN
  Administered 2017-03-13: 10 mg via INTRAVENOUS

## 2017-03-13 MED ORDER — OXYCODONE HCL 5 MG/5ML PO SOLN: 5.0000 mg | Freq: Once | ORAL | Status: DC | PRN

## 2017-03-13 MED ORDER — GLYCOPYRROLATE 0.2 MG/ML IJ SOLN
INTRAMUSCULAR | Status: DC | PRN
  Administered 2017-03-13: 0.1 mg via INTRAVENOUS

## 2017-03-13 MED ORDER — OXYCODONE HCL 5 MG PO TABS: 5.0000 mg | ORAL_TABLET | Freq: Once | ORAL | Status: DC | PRN

## 2017-03-13 MED ORDER — SULFAMETHOXAZOLE-TRIMETHOPRIM 800-160 MG PO TABS: 1.0000 | ORAL_TABLET | Freq: Two times a day (BID) | ORAL | 0 refills | Status: DC

## 2017-03-13 MED ORDER — OXYMETAZOLINE HCL 0.05 % NA SOLN
NASAL | Status: DC | PRN
  Administered 2017-03-13: 1 via TOPICAL

## 2017-03-13 MED ORDER — FENTANYL CITRATE (PF) 100 MCG/2ML IJ SOLN: 25.0000 ug | INTRAMUSCULAR | Status: DC | PRN

## 2017-03-13 MED ORDER — ONDANSETRON HCL 4 MG/2ML IJ SOLN
INTRAMUSCULAR | Status: DC | PRN
  Administered 2017-03-13: 4 mg via INTRAVENOUS

## 2017-03-13 MED ORDER — MIDAZOLAM HCL 5 MG/5ML IJ SOLN
INTRAMUSCULAR | Status: DC | PRN
  Administered 2017-03-13: 2 mg via INTRAVENOUS

## 2017-03-13 MED ORDER — LACTATED RINGERS IV SOLN
INTRAVENOUS | Status: DC
  Administered 2017-03-13: 10:00:00 via INTRAVENOUS

## 2017-03-13 MED ORDER — ACETAMINOPHEN 10 MG/ML IV SOLN
1000.0000 mg | Freq: Once | INTRAVENOUS | Status: AC
  Administered 2017-03-13: 1000 mg via INTRAVENOUS

## 2017-03-13 MED ORDER — LIDOCAINE HCL (CARDIAC) 20 MG/ML IV SOLN
INTRAVENOUS | Status: DC | PRN
  Administered 2017-03-13: 50 mg via INTRAVENOUS

## 2017-03-13 MED ORDER — PROPOFOL 10 MG/ML IV BOLUS
INTRAVENOUS | Status: DC | PRN
  Administered 2017-03-13: 130 mg via INTRAVENOUS

## 2017-03-13 MED ORDER — ONDANSETRON HCL 4 MG/2ML IJ SOLN: 4.0000 mg | Freq: Once | INTRAMUSCULAR | Status: DC | PRN

## 2017-03-13 MED ORDER — ONDANSETRON HCL 4 MG PO TABS: 4.0000 mg | ORAL_TABLET | Freq: Three times a day (TID) | ORAL | 0 refills | Status: DC | PRN

## 2017-03-13 MED ORDER — ROCURONIUM BROMIDE 100 MG/10ML IV SOLN
INTRAVENOUS | Status: DC | PRN
  Administered 2017-03-13: 15 mg via INTRAVENOUS

## 2017-03-13 MED ORDER — HYDROCODONE-ACETAMINOPHEN 5-325 MG PO TABS: 1.0000 | ORAL_TABLET | ORAL | 0 refills | Status: DC | PRN

## 2017-03-13 SURGICAL SUPPLY — 23 items
BALLN SINUPLASTY KIT 6X16 (BALLOONS) ×3
BALLOON SINUPLASTY KIT 6X16 (BALLOONS) ×1 IMPLANT
BATTERY INSTRU NAVIGATION (MISCELLANEOUS) ×9 IMPLANT
CANISTER SUCT 1200ML W/VALVE (MISCELLANEOUS) ×3 IMPLANT
COAG SUCT 10F 3.5MM HAND CTRL (MISCELLANEOUS) ×3 IMPLANT
DEVICE INFLATION SEID (MISCELLANEOUS) ×3 IMPLANT
DRAPE HEAD BAR (DRAPES) ×3 IMPLANT
DRESSING NASL FOAM PST OP SINU (MISCELLANEOUS) ×1 IMPLANT
DRSG NASAL FOAM POST OP SINU (MISCELLANEOUS) ×3
ELECT REM PT RETURN 9FT ADLT (ELECTROSURGICAL) ×3
ELECTRODE REM PT RTRN 9FT ADLT (ELECTROSURGICAL) ×1 IMPLANT
GLOVE BIO SURGEON STRL SZ7.5 (GLOVE) ×6 IMPLANT
KIT TURNOVER KIT A (KITS) ×3 IMPLANT
PACK DRAPE NASAL/ENT (PACKS) ×3 IMPLANT
PACKING NASAL EPIS 4X2.4 XEROG (MISCELLANEOUS) ×3 IMPLANT
PATTIES SURGICAL .5 X3 (DISPOSABLE) ×3 IMPLANT
SOL ANTI-FOG 6CC FOG-OUT (MISCELLANEOUS) ×1 IMPLANT
SOL FOG-OUT ANTI-FOG 6CC (MISCELLANEOUS) ×2
STRAP BODY AND KNEE 60X3 (MISCELLANEOUS) ×3 IMPLANT
SYRINGE 10CC LL (SYRINGE) ×3 IMPLANT
TRACKER CRANIALMASK (MASK) ×3 IMPLANT
TUBING DECLOG MULTIDEBRIDER (TUBING) ×3 IMPLANT
WATER STERILE IRR 250ML POUR (IV SOLUTION) ×3 IMPLANT

## 2017-03-13 NOTE — Anesthesia Preprocedure Evaluation (Signed)
Anesthesia Evaluation  Patient identified by MRN, date of birth, ID band Patient awake    Reviewed: Allergy & Precautions, H&P , NPO status , Patient's Chart, lab work & pertinent test results, reviewed documented beta blocker date and time   Airway Mallampati: II  TM Distance: >3 FB Neck ROM: full    Dental no notable dental hx.    Pulmonary sleep apnea ,    Pulmonary exam normal breath sounds clear to auscultation       Cardiovascular Exercise Tolerance: Good hypertension,  Rhythm:regular Rate:Normal     Neuro/Psych  Headaches, Anxiety Depression    GI/Hepatic negative GI ROS, Neg liver ROS,   Endo/Other  diabetes, Type 2Hypothyroidism   Renal/GU negative Renal ROS  negative genitourinary   Musculoskeletal   Abdominal   Peds  Hematology negative hematology ROS (+)   Anesthesia Other Findings   Reproductive/Obstetrics negative OB ROS                             Anesthesia Physical Anesthesia Plan  ASA: II  Anesthesia Plan: General ETT   Post-op Pain Management:    Induction:   PONV Risk Score and Plan:   Airway Management Planned:   Additional Equipment:   Intra-op Plan:   Post-operative Plan:   Informed Consent: I have reviewed the patients History and Physical, chart, labs and discussed the procedure including the risks, benefits and alternatives for the proposed anesthesia with the patient or authorized representative who has indicated his/her understanding and acceptance.   Dental Advisory Given  Plan Discussed with: CRNA  Anesthesia Plan Comments:         Anesthesia Quick Evaluation

## 2017-03-13 NOTE — Anesthesia Procedure Notes (Signed)
Procedure Name: Intubation Date/Time: 03/13/2017 10:31 AM Performed by: Mayme Genta, CRNA Pre-anesthesia Checklist: Patient identified, Emergency Drugs available, Suction available, Patient being monitored and Timeout performed Patient Re-evaluated:Patient Re-evaluated prior to induction Oxygen Delivery Method: Circle system utilized Preoxygenation: Pre-oxygenation with 100% oxygen Induction Type: IV induction Ventilation: Mask ventilation without difficulty Laryngoscope Size: Miller and 2 Grade View: Grade II Tube type: Oral Rae Tube size: 7.0 mm Number of attempts: 1 Airway Equipment and Method: Bougie stylet Placement Confirmation: ETT inserted through vocal cords under direct vision,  positive ETCO2 and breath sounds checked- equal and bilateral Tube secured with: Tape Dental Injury: Teeth and Oropharynx as per pre-operative assessment  Difficulty Due To: Difficult Airway- due to anterior larynx and Difficult Airway- due to reduced neck mobility Comments: Pt airway anterior upon laryngoscopy. Bougie stylet inserted without difficulty and 7.0 Oral Rae ETT inserted over stylet. Tolerated well by pt.

## 2017-03-13 NOTE — Anesthesia Postprocedure Evaluation (Signed)
Anesthesia Post Note  Patient: Kelly Wells  Procedure(s) Performed: IMAGE GUIDED SINUS SURGERY (Left Nose) MAXILLARY ANTROSTOMY WITH TISSUE REMOVAL (Left Nose) ANTERIOR ETHMOIDECTOMY WITH FRONTAL SINUSOTOMY (Left Nose)  Patient location during evaluation: PACU Anesthesia Type: General Level of consciousness: awake and alert Pain management: pain level controlled Vital Signs Assessment: post-procedure vital signs reviewed and stable Respiratory status: spontaneous breathing, nonlabored ventilation, respiratory function stable and patient connected to nasal cannula oxygen Cardiovascular status: blood pressure returned to baseline and stable Postop Assessment: no apparent nausea or vomiting Anesthetic complications: no Comments: Mild petechiae noted on dorsum of L hand.  Removed blood pressure cuff from LUE and told patient and daughter to call if worsening or not improving over next couple days.    Alisa Graff

## 2017-03-13 NOTE — Op Note (Signed)
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  Erythema and friable tissue throughout ethmoid and in frontal sinus recess.  Dehiscence of left medial orbital wall anteriorly versus severe thinning.  The medial orbital wall was carefully protected  Procedure: After the patient was identified in holding and the benefits of the procedure were reviewed as well as the consent and risks.  The patient was taken to the operating room and with the patient in a comfortable supine position,  general orotracheal anesthesia was induced without difficulty.  A proper time-out was performed.    The [employer] image guidance system was set up and calibrated in the normal fashion with an acceptable error of 0.20mm.       The patient next received preoperative Afrin spray for topical decongestion and vasoconstriction.  Several minutes were allowed for this to take effect.  Cottoniod pledgets soaked in Afrin were placed into both nasal cavities and left while the patient was prepped and draped in the standard fashion.  The materials were removed from the nose and observed to be intact and correct in number.  The nose was next inspected with a zero degree endoscope and the middle turbinates were medialized and afrin soaked pledgets were placed lateral to the middle turbinates for approximately one minute.  At this time, attention was directed to the patient's left sinus cavities.  The uncinate  process was noted be already infractured due to purulence and filling of the maxillary cavity.  A friable mass was protruding through maxillary ostia consistent with either bacterial matting versus a fungus ballr.  At this time attention was directed to the patient's maxillary sinuses.  The friable mass was carefully removed in a  Piecemeal fashion revealing a large defect in the medial maxillary wall.  On the left, a ball tipped probe was placed through the natural ostia and this was used to create a larger opening.  Using a pediatric back biting forceps the antrostomy was enlarged.  Purulence was noted in the maxillary sinus and this was removed and the sinus was extensively irrigated.  Hemostasis was performed with topical Afrin soaked pledgets.  Visualization with a zero degree endoscope was used to examine the maxillary antrostomy which was noted to be widely patent and in continuity with the natural os..  Next attention was directed to the patient's ethmoid sinuses.  The left ethmoid bulla was entered with a straight image guidance suction.  The ethmoid cells were opened from a medial and inferior position superior and laterally until the vertical lamella was encountered.  All fragments of bone and mucosa were removed with straight biting forceps.  Image guidance was used throughout to ensure all diseased air cells were opened.  Care was taken to avoid trauma to the medial orbital wall which showed significant thinning/dehiscence on exam and CT scan.  At this time, attention was directed to the patient's left frontal sinus.  Friable tissue was  carefully removed from the left frontal sinus outflow tract with a 45 degree upbiting forceps.  Once the tract was visualized, an Acclarent balloon sinuplasty device was used to dilated the tract x3.  Proper transillumination of the left frontal sinus was seen throughout.  Once the tract was dilated, remnants of the ager nasi air cells were removed with 45 degree  pediatric forceps.  This demonstrated a widely patent and non-obstructed frontal sinus.  At this time with all planned sinuses opened, the patient's nasal cavity was examinated and copiously irrigated with sterile saline.  Meticulous hemostasis was continued and all diseased sinuses were examined and noted to be widely patent.    Next, stamberger sinufoam was placed within the anterior ethmoid cavity and left maxillary anstrostomy.  Xerogel was placed lateral to the middle turbinate.  This was inflated with sterile water.  Stamberger sinufoam was then placed anterior to the [facility] as well.    Care of the patient at this time was transferred to anesthesia and was extubated and taken to PACU in good condition.  Dispo:   PACU to home  Plan: Ice, elevation, narcotic analgesia and prophylactic antibiotics.  We will reevaluate the patient in the office in 7 days.   strenuous activities in two weeks.   Addley Ballinger 03/13/2017 11:34 AM

## 2017-03-13 NOTE — Transfer of Care (Signed)
Immediate Anesthesia Transfer of Care Note  Patient: Kelly Wells  Procedure(s) Performed: IMAGE GUIDED SINUS SURGERY (Left Nose) MAXILLARY ANTROSTOMY WITH TISSUE REMOVAL (Left Nose) ANTERIOR ETHMOIDECTOMY WITH FRONTAL SINUSOTOMY (Left Nose)  Patient Location: PACU  Anesthesia Type: General ETT  Level of Consciousness: awake, alert  and patient cooperative  Airway and Oxygen Therapy: Patient Spontanous Breathing and Patient connected to supplemental oxygen  Post-op Assessment: Post-op Vital signs reviewed, Patient's Cardiovascular Status Stable, Respiratory Function Stable, Patent Airway and No signs of Nausea or vomiting  Post-op Vital Signs: Reviewed and stable  Complications: No apparent anesthesia complications

## 2017-03-13 NOTE — H&P (Signed)
..  History and Physical paper copy reviewed and updated date of procedure and will be scanned into system.  Patient seen and examined and marked.  

## 2017-03-18 LAB — SURGICAL PATHOLOGY

## 2017-03-21 ENCOUNTER — Emergency Department
Admission: EM | Admit: 2017-03-21 | Discharge: 2017-03-21 | Disposition: A | Discharge status: Home / Self Care | Payer: Medicare Other | Attending: Emergency Medicine | Admitting: Emergency Medicine

## 2017-03-21 ENCOUNTER — Encounter: Payer: Self-pay | Admitting: Emergency Medicine

## 2017-03-21 ENCOUNTER — Emergency Department: Payer: Medicare Other

## 2017-03-21 ENCOUNTER — Other Ambulatory Visit: Payer: Self-pay

## 2017-03-21 DIAGNOSIS — E119 Type 2 diabetes mellitus without complications: Secondary | ICD-10-CM | POA: Diagnosis not present

## 2017-03-21 DIAGNOSIS — Y92009 Unspecified place in unspecified non-institutional (private) residence as the place of occurrence of the external cause: Secondary | ICD-10-CM | POA: Diagnosis not present

## 2017-03-21 DIAGNOSIS — Z9049 Acquired absence of other specified parts of digestive tract: Secondary | ICD-10-CM | POA: Diagnosis not present

## 2017-03-21 DIAGNOSIS — S0990XA Unspecified injury of head, initial encounter: Secondary | ICD-10-CM | POA: Diagnosis not present

## 2017-03-21 DIAGNOSIS — Z7984 Long term (current) use of oral hypoglycemic drugs: Secondary | ICD-10-CM | POA: Insufficient documentation

## 2017-03-21 DIAGNOSIS — W108XXA Fall (on) (from) other stairs and steps, initial encounter: Secondary | ICD-10-CM | POA: Diagnosis not present

## 2017-03-21 DIAGNOSIS — Z79899 Other long term (current) drug therapy: Secondary | ICD-10-CM | POA: Insufficient documentation

## 2017-03-21 DIAGNOSIS — F329 Major depressive disorder, single episode, unspecified: Secondary | ICD-10-CM | POA: Diagnosis not present

## 2017-03-21 DIAGNOSIS — Y9389 Activity, other specified: Secondary | ICD-10-CM | POA: Diagnosis not present

## 2017-03-21 DIAGNOSIS — W19XXXA Unspecified fall, initial encounter: Secondary | ICD-10-CM

## 2017-03-21 DIAGNOSIS — Y998 Other external cause status: Secondary | ICD-10-CM | POA: Insufficient documentation

## 2017-03-21 DIAGNOSIS — S3992XA Unspecified injury of lower back, initial encounter: Secondary | ICD-10-CM | POA: Diagnosis present

## 2017-03-21 DIAGNOSIS — S32010A Wedge compression fracture of first lumbar vertebra, initial encounter for closed fracture: Secondary | ICD-10-CM | POA: Insufficient documentation

## 2017-03-21 DIAGNOSIS — M545 Low back pain, unspecified: Secondary | ICD-10-CM

## 2017-03-21 DIAGNOSIS — F419 Anxiety disorder, unspecified: Secondary | ICD-10-CM | POA: Diagnosis not present

## 2017-03-21 DIAGNOSIS — E039 Hypothyroidism, unspecified: Secondary | ICD-10-CM | POA: Diagnosis not present

## 2017-03-21 DIAGNOSIS — I10 Essential (primary) hypertension: Secondary | ICD-10-CM | POA: Diagnosis not present

## 2017-03-21 MED ORDER — CYCLOBENZAPRINE HCL 5 MG PO TABS: 5.0000 mg | ORAL_TABLET | Freq: Three times a day (TID) | ORAL | 0 refills | Status: DC | PRN

## 2017-03-21 MED ORDER — KETOROLAC TROMETHAMINE 30 MG/ML IJ SOLN
30.0000 mg | Freq: Once | INTRAMUSCULAR | Status: AC
  Administered 2017-03-21: 30 mg via INTRAMUSCULAR
  Filled 2017-03-21: qty 1

## 2017-03-21 MED ORDER — CYCLOBENZAPRINE HCL 10 MG PO TABS
5.0000 mg | ORAL_TABLET | Freq: Once | ORAL | Status: AC
  Administered 2017-03-21: 5 mg via ORAL
  Filled 2017-03-21: qty 1

## 2017-03-21 MED ORDER — OXYCODONE-ACETAMINOPHEN 5-325 MG PO TABS: 1.0000 | ORAL_TABLET | Freq: Four times a day (QID) | ORAL | 0 refills | Status: DC | PRN

## 2017-03-21 MED ORDER — MELOXICAM 15 MG PO TABS: 15.0000 mg | ORAL_TABLET | Freq: Every day | ORAL | 0 refills | Status: AC

## 2017-03-21 MED ORDER — OXYCODONE-ACETAMINOPHEN 5-325 MG PO TABS
1.0000 | ORAL_TABLET | Freq: Once | ORAL | Status: AC
  Administered 2017-03-21: 1 via ORAL
  Filled 2017-03-21: qty 1

## 2017-03-21 NOTE — Discharge Instructions (Signed)
Follow-up with your primary care provider Dr. Lovie Macadamia next week. Take medication only as directed.  Flexeril every 8 hours as needed for muscle spasms, meloxicam 1 daily with food and Percocet 5 mg 1 every 6 hours as needed for moderate to severe pain.  You may use ice or heat to your back as needed for comfort.  Do not lift, push, pull on anything over the weekend.  Be careful with your medications as Flexeril and Percocet could cause drowsiness and increase your risk for falling. Return to the ED this weekend if any worsening of your symptoms.

## 2017-03-21 NOTE — ED Triage Notes (Signed)
Pt presents to ED c/o lower back pain after falling from 5-step ladder while replacing outdoor lights. Pt reports she fell backward, landing on dirt ground and hit head. Pt denies LOC. No hematomas or bleeding to head noted at this time. Pt ambulatory to triage room with steady gait. Fall occurred around 11am today. Denies headache.

## 2017-03-21 NOTE — ED Provider Notes (Signed)
[facility] Emergency Department Provider Note  ____________________________________________   First MD Initiated Contact with Patient 03/21/17 1801     (approximate)  I have reviewed the triage vital signs and the nursing notes.   HISTORY  Chief Complaint Fall   HPI Kelly Wells is a 72 y.o. female resents to the emergency department with low back pain after falling off a 5 step ladder while replacing outdoor lights.  Patient states she fell backwards landing on either the ground or some stones.  She states she did hit her head but is unaware of any LOC.  She denies any nausea, vomiting, visual changes.  Patient denies the use of blood thinners or aspirin daily.  Patient has been ambulatory since her accident but has continued to have low back pain since then.  This injury occurred earlier today.  Patient drove herself to the ED.  She rates her pain as a 10/10.   Past Medical History:  Diagnosis Date  . Anemia   . Anxiety   . Arthritis    KNEES,HANDS  . Depression   . Diabetes mellitus without complication ([place])    type 2  . Headache    SINUS ISSUES  . Hyperlipidemia   . Hypertension   . Hypothyroidism   . Sinusitis    chronic, with nasal obstruction,tinnitis, and hearing loss  . Sleep apnea    NO CPAP    There are no active problems to display for this patient.   Past Surgical History:  Procedure Laterality Date  . ABDOMINAL HYSTERECTOMY    . BREAST BIOPSY Bilateral 1980's   sugical bx   . CARDIAC CATHETERIZATION     YEARS AGO  . CHOLECYSTECTOMY    . COLONOSCOPY WITH PROPOFOL N/A 07/04/2015   Procedure: COLONOSCOPY WITH PROPOFOL;  Surgeon: Manya Silvas, MD;  Location: [facility] ENDOSCOPY;  Service: Endoscopy;  Laterality: N/A;  . ETHMOIDECTOMY Left 03/13/2017   Procedure: ANTERIOR ETHMOIDECTOMY WITH FRONTAL SINUSOTOMY;  Surgeon: Carloyn Manner, MD;  Location: [place];  Service: ENT;  Laterality: Left;  . IMAGE GUIDED SINUS  SURGERY Left 03/13/2017   Procedure: IMAGE GUIDED SINUS SURGERY;  Surgeon: Carloyn Manner, MD;  Location: [place];  Service: ENT;  Laterality: Left;  NEED STRYKER DISK DIABETIC/ SLEEP APNEA-no CPAP GAVE DISK TO CECE 2-28  . JOINT REPLACEMENT Left 2005   knee  . MAXILLARY ANTROSTOMY Left 03/13/2017   Procedure: MAXILLARY ANTROSTOMY WITH TISSUE REMOVAL;  Surgeon: Carloyn Manner, MD;  Location: [place];  Service: ENT;  Laterality: Left;    Prior to Admission medications   Medication Sig Start Date End Date Taking? Authorizing Provider  amLODipine (NORVASC) 5 MG tablet Take 5 mg by mouth daily.    [provider]  clonazePAM (KLONOPIN) 0.5 MG tablet Take 0.5 mg by mouth 2 (two) times daily as needed for anxiety.    [provider]  cyclobenzaprine (FLEXERIL) 5 MG tablet Take 1 tablet (5 mg total) by mouth 3 (three) times daily as needed for muscle spasms. 03/21/17   Johnn Hai, PA-C  Exenatide ER (BYDUREON) 2 MG PEN Inject into the skin. ONCE PER WEEK    [provider]  FLUoxetine (PROZAC) 40 MG capsule Take 40 mg by mouth daily. AM    [provider]  fluticasone (FLONASE) 50 MCG/ACT nasal spray Place 2 sprays into both nostrils daily.     [provider]  levothyroxine (SYNTHROID, LEVOTHROID) 88 MCG tablet Take 88 mcg  by mouth daily before breakfast.    [provider]  loperamide (IMODIUM A-D) 2 MG tablet Take 2 mg by mouth 4 (four) times daily as needed for diarrhea or loose stools.    [provider]  meloxicam (MOBIC) 15 MG tablet Take 1 tablet (15 mg total) by mouth daily. 03/21/17 03/21/18  Johnn Hai, PA-C  metFORMIN (GLUCOPHAGE) 1000 MG tablet Take 1,000 mg by mouth 2 (two) times daily with a meal.    [provider]  omeprazole (PRILOSEC) 20 MG capsule Take 20 mg by mouth as needed.     [provider]  ondansetron (ZOFRAN) 4 MG tablet Take 1 tablet (4 mg total) by  mouth every 8 (eight) hours as needed for up to 10 doses for nausea or vomiting. 03/13/17   Vaught, Jeannie Fend, MD  oxyCODONE-acetaminophen (PERCOCET) 5-325 MG tablet Take 1 tablet by mouth every 6 (six) hours as needed for severe pain. 03/21/17   Johnn Hai, PA-C    Allergies Keflex [cephalexin] and Clindamycin/lincomycin  Family History  Problem Relation Age of Onset  . Stroke Mother     Social History Social History   Tobacco Use  . Smoking status: Never Smoker  . Smokeless tobacco: Never Used  Substance Use Topics  . Alcohol use: No    Frequency: Never  . Drug use: No    Review of Systems Constitutional: No fever/chills Eyes: No visual changes. ENT: No trauma. Cardiovascular: Denies chest pain. Respiratory: Denies shortness of breath. Gastrointestinal: No abdominal pain.  No nausea, no vomiting.  Musculoskeletal: Positive for low back pain. Skin: Negative for laceration.  Positive tender posterior scalp. Neurological: Negative for headaches, focal weakness or numbness. ____________________________________________   PHYSICAL EXAM:  VITAL SIGNS: ED Triage Vitals  Enc Vitals Group     BP 03/21/17 1732 (!) 130/92     Pulse Rate 03/21/17 1732 94     Resp 03/21/17 1732 20     Temp 03/21/17 1732 98.8 F (37.1 C)     Temp Source 03/21/17 1732 Oral     SpO2 03/21/17 1732 95 %     Weight 03/21/17 1732 189 lb (85.7 kg)     Height 03/21/17 1732 5\' 5"  (1.651 m)     Head Circumference --      Peak Flow --      Pain Score 03/21/17 1736 10     Pain Loc --      Pain Edu? --      Excl. in [place]? --    Constitutional: Alert and oriented. Well appearing and in no acute distress. Eyes: Conjunctivae are normal. PERRL. EOMI. Head: Tender posterior scalp but no abrasions or ecchymosis is noted.  No gross deformity.  No hematoma is appreciated at this time. Nose: No trauma. Neck: No stridor.  Nontender cervical spine to palpation posteriorly. Cardiovascular: Normal rate,  regular rhythm. Grossly normal heart sounds.  Good peripheral circulation. Respiratory: Normal respiratory effort.  No retractions. Lungs CTAB. Gastrointestinal: Soft and nontender. No distention.  Bowel sounds normoactive and no abdominal bruising is noted. Musculoskeletal: On exam of the thoracic spine there is no point tenderness on palpation and no abrasions or ecchymosis present.  There is some tenderness on palpation of the L5-S1 area and sacrum to palpation.  No active muscle spasms were noted but patient did have difficulty with range of motion.  No soft tissue swelling or abrasions were noted.  Patient is nontender on palpation of the pelvis or lower extremities.  She is able to move her upper extremities without any difficulty or pain. Neurologic:  Normal speech and language. No gross focal neurologic deficits are appreciated.  Skin:  Skin is warm, dry and intact.  Psychiatric: Mood and affect are normal. Speech and behavior are normal.  ____________________________________________   LABS (all labs ordered are listed, but only abnormal results are displayed)  Labs Reviewed - No data to display  RADIOLOGY  ED MD interpretation:   X-ray lumbar spine shows a compression fracture of L1 vertebra.  Official radiology report(s): Dg Lumbar Spine 2-3 Views  Result Date: 03/21/2017 CLINICAL DATA:  72 year old female with lower back pain. EXAM: LUMBAR SPINE - 2-3 VIEW COMPARISON:  Lumbar spine MRI dated 06/04/2005 FINDINGS: There is a compression fracture of the L1 vertebra with approximately 50% loss of vertebral body height and anterior wedging. This is age indeterminate but an acute fracture is not excluded. Correlation with clinical exam and point tenderness recommended. CT or MRI may provide better evaluation if clinically indicated. There appears to be a degree of buckling of the posterior cortex of the T1. If there is focal neurological deficit or clinical findings of cord compression  further evaluation with MRI is recommended. No other fracture identified. The bones are osteopenic. Multilevel degenerative changes and facet arthropathy. The soft tissues are grossly unremarkable. IMPRESSION: Age indeterminate compression fracture of the T1 vertebra, possibly acute. Correlation with clinical exam recommended. CT or MRI may provide better evaluation. Electronically Signed   By: Anner Crete M.D.   On: 03/21/2017 18:45   Ct Head Wo Contrast  Result Date: 03/21/2017 CLINICAL DATA:  Fall from ladder with head injury. Dizziness and nausea. EXAM: CT HEAD WITHOUT CONTRAST CT CERVICAL SPINE WITHOUT CONTRAST TECHNIQUE: Multidetector CT imaging of the head and cervical spine was performed following the standard protocol without intravenous contrast. Multiplanar CT image reconstructions of the cervical spine were also generated. COMPARISON:  None. FINDINGS: CT HEAD FINDINGS Brain: There is no evidence for acute hemorrhage, hydrocephalus, mass lesion, or abnormal extra-axial fluid collection. No definite CT evidence for acute infarction. Diffuse loss of parenchymal volume is consistent with atrophy. Old left cerebellar infarct noted. Vascular: No hyperdense vessel or unexpected calcification. Skull: No evidence for fracture. No worrisome lytic or sclerotic lesion. Sinuses/Orbits: Scattered opacification of the left ethmoid air cells associated with mucosal thickening left maxillary sinus. Visualized portions of the globes and intraorbital fat are unremarkable. Other: None. CT CERVICAL SPINE FINDINGS Alignment: Straightening of normal cervical lordosis. Skull base and vertebrae: No acute fracture. No primary bone lesion or focal pathologic process. Soft tissues and spinal canal: No prevertebral fluid or swelling. No visible canal hematoma. Disc levels:  Loss of disc height noted C5-6. Upper chest: Unremarkable Other: None. IMPRESSION: 1. No acute intracranial abnormality. 2. Old small left cerebellar  infarct. 3. Degenerative disc disease at C5-6. No evidence for cervical spine fracture. 4. Loss of cervical lordosis. This can be related to patient positioning, muscle spasm or soft tissue injury. Electronically Signed   By: Misty Stanley M.D.   On: 03/21/2017 19:00   Ct Cervical Spine Wo Contrast  Result Date: 03/21/2017 CLINICAL DATA:  Fall from ladder with head injury. Dizziness and nausea. EXAM: CT HEAD WITHOUT CONTRAST CT CERVICAL SPINE WITHOUT CONTRAST TECHNIQUE: Multidetector CT imaging of the head and cervical spine was performed following the standard protocol without intravenous contrast. Multiplanar CT image reconstructions of the cervical spine were also generated. COMPARISON:  None. FINDINGS: CT HEAD FINDINGS Brain: There is  no evidence for acute hemorrhage, hydrocephalus, mass lesion, or abnormal extra-axial fluid collection. No definite CT evidence for acute infarction. Diffuse loss of parenchymal volume is consistent with atrophy. Old left cerebellar infarct noted. Vascular: No hyperdense vessel or unexpected calcification. Skull: No evidence for fracture. No worrisome lytic or sclerotic lesion. Sinuses/Orbits: Scattered opacification of the left ethmoid air cells associated with mucosal thickening left maxillary sinus. Visualized portions of the globes and intraorbital fat are unremarkable. Other: None. CT CERVICAL SPINE FINDINGS Alignment: Straightening of normal cervical lordosis. Skull base and vertebrae: No acute fracture. No primary bone lesion or focal pathologic process. Soft tissues and spinal canal: No prevertebral fluid or swelling. No visible canal hematoma. Disc levels:  Loss of disc height noted C5-6. Upper chest: Unremarkable Other: None. IMPRESSION: 1. No acute intracranial abnormality. 2. Old small left cerebellar infarct. 3. Degenerative disc disease at C5-6. No evidence for cervical spine fracture. 4. Loss of cervical lordosis. This can be related to patient positioning,  muscle spasm or soft tissue injury. Electronically Signed   By: Misty Stanley M.D.   On: 03/21/2017 19:00   Ct Lumbar Spine Wo Contrast  Result Date: 03/21/2017 CLINICAL DATA:  72 y/o F; status post fall on Sunday now with difficulty bearing weight. Abnormal lumbar spine radiograph. EXAM: CT LUMBAR SPINE WITHOUT CONTRAST TECHNIQUE: Multidetector CT imaging of the lumbar spine was performed without intravenous contrast administration. Multiplanar CT image reconstructions were also generated. COMPARISON:  03/21/2017 lumbar spine radiograph. 06/04/2005 lumbar spine MRI. FINDINGS: Segmentation: 5 lumbar type vertebrae. Alignment: L4-5 grade 1 anterolisthesis. Vertebrae: L1 superior endplate fracture involving anterior and middle columns with 50% loss of vertebral body height and 6 mm retropulsion of superior endplate. Fracture lines are persistent indicating recent injury. No other acute fracture identified. Paraspinal and other soft tissues: Mild paravertebral edema at level of T1 fracture. Aortic atherosclerosis. Disc levels: Multilevel disc bulges and facet hypertrophy resulting in mild foraminal stenosis bilaterally at L4-5 and L5-S1 levels. Retropulsion of L1 superior endplate results in mild bony spinal canal stenosis. At L4-5 anterolisthesis and uncovered disc bulge combined with ligamentum flavum hypertrophy results in moderate to severe canal stenosis. IMPRESSION: 1. L1 superior endplate fracture involving anterior and middle columns with 50% loss of vertebral body height and 6 mm retropulsion of superior endplate. Fracture lines are persistent indicating recent injury. No other acute fracture identified. 2. Retropulsion of L1 superior endplate results in mild bony spinal canal stenosis. 3. L4-5 multifactorial moderate to severe canal stenosis. Electronically Signed   By: Kristine Garbe M.D.   On: 03/21/2017 20:02    ____________________________________________   PROCEDURES  Procedure(s)  performed: None  Procedures  Critical Care performed: No  ____________________________________________   INITIAL IMPRESSION / ASSESSMENT AND PLAN / ED COURSE Patient was made aware that she does have a compression fracture at L1.  Most of her pain in the ED is centered around her lower lumbar area.  She denies any previous compression fractures and x-rays from 2013 were reviewed with no mention of compression fracture at that time.  Patient was given Percocet, Flexeril and Toradol 30 mg IM in the department.  Patient states that she is not drowsy.  She continues to have some discomfort but is sitting on the edge of the stretcher while talking with her and her family about her injury.  Patient was discharged with prescription for Percocet, Flexeril and meloxicam.  She is aware that this medication could cause drowsiness and increase her risk for falling.  She does have a walker at home.  She is to follow-up with Dr. Lovie Macadamia who is her PCP next week.  ____________________________________________   FINAL CLINICAL IMPRESSION(S) / ED DIAGNOSES  Final diagnoses:  Closed compression fracture of first lumbar vertebra, initial encounter ([place])  Acute midline low back pain without sciatica  Fall in home, initial encounter     ED Discharge Orders        Ordered    cyclobenzaprine (FLEXERIL) 5 MG tablet  3 times daily PRN     03/21/17 2136    oxyCODONE-acetaminophen (PERCOCET) 5-325 MG tablet  Every 6 hours PRN     03/21/17 2136    meloxicam (MOBIC) 15 MG tablet  Daily     03/21/17 2136       Note:  This document was prepared using Dragon voice recognition software and may include unintentional dictation errors.    Johnn Hai, PA-C 03/22/17 7903    Darel Hong, MD 03/22/17 2337

## 2017-03-21 NOTE — ED Notes (Signed)
Pt states she feel onto back off a ladder today about noon, it was a 3 step ladder and she fell from top step, she was able to get up and into house, took some ibuprofen but has continued pain across lower back, drove herself here

## 2017-03-21 NOTE — ED Notes (Signed)
First RN note:  Patient brought by [facility] clinic c/o a fall off of a ladder today.  Patient fell on her back and hit her head.  Patient c/o dizziness and nausea associated with it but denies any LOC.

## 2017-03-27 ENCOUNTER — Other Ambulatory Visit: Payer: Self-pay | Admitting: Family Medicine

## 2017-03-27 DIAGNOSIS — Z1231 Encounter for screening mammogram for malignant neoplasm of breast: Secondary | ICD-10-CM

## 2017-04-30 ENCOUNTER — Ambulatory Visit
Admission: RE | Admit: 2017-04-30 | Discharge: 2017-04-30 | Disposition: A | Discharge status: Home / Self Care | Payer: Medicare Other | Source: Ambulatory Visit | Attending: Family Medicine | Admitting: Family Medicine

## 2017-04-30 DIAGNOSIS — Z1231 Encounter for screening mammogram for malignant neoplasm of breast: Secondary | ICD-10-CM | POA: Insufficient documentation

## 2017-07-08 ENCOUNTER — Other Ambulatory Visit: Payer: Self-pay

## 2017-07-08 ENCOUNTER — Emergency Department
Admission: EM | Admit: 2017-07-08 | Discharge: 2017-07-08 | Disposition: A | Discharge status: Home / Self Care | Payer: Medicare Other | Attending: Emergency Medicine | Admitting: Emergency Medicine

## 2017-07-08 ENCOUNTER — Emergency Department: Payer: Medicare Other

## 2017-07-08 DIAGNOSIS — E119 Type 2 diabetes mellitus without complications: Secondary | ICD-10-CM | POA: Diagnosis not present

## 2017-07-08 DIAGNOSIS — E039 Hypothyroidism, unspecified: Secondary | ICD-10-CM | POA: Diagnosis not present

## 2017-07-08 DIAGNOSIS — Z96652 Presence of left artificial knee joint: Secondary | ICD-10-CM | POA: Insufficient documentation

## 2017-07-08 DIAGNOSIS — N3000 Acute cystitis without hematuria: Secondary | ICD-10-CM | POA: Diagnosis not present

## 2017-07-08 DIAGNOSIS — R112 Nausea with vomiting, unspecified: Secondary | ICD-10-CM | POA: Insufficient documentation

## 2017-07-08 DIAGNOSIS — N179 Acute kidney failure, unspecified: Secondary | ICD-10-CM | POA: Insufficient documentation

## 2017-07-08 DIAGNOSIS — E860 Dehydration: Secondary | ICD-10-CM | POA: Insufficient documentation

## 2017-07-08 DIAGNOSIS — I10 Essential (primary) hypertension: Secondary | ICD-10-CM | POA: Insufficient documentation

## 2017-07-08 DIAGNOSIS — R799 Abnormal finding of blood chemistry, unspecified: Secondary | ICD-10-CM | POA: Diagnosis present

## 2017-07-08 LAB — CBC WITH DIFFERENTIAL/PLATELET
BASOS ABS: 0.1 10*3/uL (ref 0–0.1)
BASOS PCT: 1 %
Eosinophils Absolute: 0.1 10*3/uL (ref 0–0.7)
Eosinophils Relative: 1 %
HEMATOCRIT: 37.2 % (ref 35.0–47.0)
Hemoglobin: 12.4 g/dL (ref 12.0–16.0)
LYMPHS PCT: 26 %
Lymphs Abs: 1.9 10*3/uL (ref 1.0–3.6)
MCH: 29.9 pg (ref 26.0–34.0)
MCHC: 33.2 g/dL (ref 32.0–36.0)
MCV: 89.9 fL (ref 80.0–100.0)
MONO ABS: 0.4 10*3/uL (ref 0.2–0.9)
Monocytes Relative: 5 %
NEUTROS ABS: 4.7 10*3/uL (ref 1.4–6.5)
Neutrophils Relative %: 67 %
Platelets: 286 10*3/uL (ref 150–440)
RBC: 4.13 MIL/uL (ref 3.80–5.20)
RDW: 14.6 % — AB (ref 11.5–14.5)
WBC: 7 10*3/uL (ref 3.6–11.0)

## 2017-07-08 LAB — COMPREHENSIVE METABOLIC PANEL
ALBUMIN: 4.4 g/dL (ref 3.5–5.0)
ALT: 22 U/L (ref 0–44)
AST: 29 U/L (ref 15–41)
Alkaline Phosphatase: 219 U/L — ABNORMAL HIGH (ref 38–126)
Anion gap: 9 (ref 5–15)
BILIRUBIN TOTAL: 0.7 mg/dL (ref 0.3–1.2)
BUN: 29 mg/dL — ABNORMAL HIGH (ref 8–23)
CHLORIDE: 103 mmol/L (ref 98–111)
CO2: 23 mmol/L (ref 22–32)
CREATININE: 1.64 mg/dL — AB (ref 0.44–1.00)
Calcium: 9.2 mg/dL (ref 8.9–10.3)
GFR calc Af Amer: 35 mL/min — ABNORMAL LOW (ref >60)
GFR calc non Af Amer: 30 mL/min — ABNORMAL LOW (ref >60)
Glucose, Bld: 110 mg/dL — ABNORMAL HIGH (ref 70–99)
Potassium: 4.4 mmol/L (ref 3.5–5.1)
Sodium: 135 mmol/L (ref 135–145)
TOTAL PROTEIN: 8.2 g/dL — AB (ref 6.5–8.1)

## 2017-07-08 LAB — URINALYSIS, COMPLETE (UACMP) WITH MICROSCOPIC
Bilirubin Urine: NEGATIVE
Glucose, UA: NEGATIVE mg/dL
Hgb urine dipstick: NEGATIVE
Ketones, ur: NEGATIVE mg/dL
Nitrite: NEGATIVE
Protein, ur: NEGATIVE mg/dL
SPECIFIC GRAVITY, URINE: 1.02 (ref 1.005–1.030)
pH: 5 (ref 5.0–8.0)

## 2017-07-08 LAB — TROPONIN I

## 2017-07-08 MED ORDER — CIPROFLOXACIN HCL 500 MG PO TABS: 500.0000 mg | ORAL_TABLET | Freq: Two times a day (BID) | ORAL | 0 refills | Status: AC

## 2017-07-08 MED ORDER — SODIUM CHLORIDE 0.9 % IV SOLN
1.0000 g | Freq: Once | INTRAVENOUS | Status: AC
  Administered 2017-07-08: 1 g via INTRAVENOUS
  Filled 2017-07-08: qty 10

## 2017-07-08 MED ORDER — ONDANSETRON 4 MG PO TBDP: 4.0000 mg | ORAL_TABLET | Freq: Three times a day (TID) | ORAL | 0 refills | Status: DC | PRN

## 2017-07-08 MED ORDER — SODIUM CHLORIDE 0.9 % IV SOLN
Freq: Once | INTRAVENOUS | Status: AC
  Administered 2017-07-08: 09:00:00 via INTRAVENOUS

## 2017-07-08 MED ORDER — SODIUM CHLORIDE 0.9 % IV SOLN
Freq: Once | INTRAVENOUS | Status: AC
Start: 2017-07-08 — End: 2017-07-08
  Administered 2017-07-08: 11:00:00 via INTRAVENOUS

## 2017-07-08 NOTE — ED Triage Notes (Signed)
Pt states that she had labs drawn Friday and told that her kidney function was abnormal. Reports emesis for past few weeks. Pt alert and oriented X4, active, cooperative, pt in NAD. RR even and unlabored, color WNL.  Ambulates independently.

## 2017-07-08 NOTE — ED Provider Notes (Signed)
[facility] Department Provider Note       Time seen: ----------------------------------------- 8:58 AM on 07/08/2017 -----------------------------------------   I have reviewed the triage vital signs and the nursing notes.  HISTORY   Chief Complaint Abnormal Lab    HPI Kelly Wells is a 72 y.o. female with a history of anemia, arthritis, depression, diabetes, hyperlipidemia and hypertension who presents to the ED for potential lab abnormality.  Patient states she had labs drawn on Friday and was told that her kidney function was abnormal.  She has had nausea vomiting for the past several weeks, medication prescribed by her doctor is not helping.  Patient currently feels dehydrated, she denies fevers, chills, back pain or other complaints.  Past Medical History:  Diagnosis Date  . Anemia   . Anxiety   . Arthritis    KNEES,HANDS  . Depression   . Diabetes mellitus without complication ([place])    type 2  . Headache    SINUS ISSUES  . Hyperlipidemia   . Hypertension   . Hypothyroidism   . Sinusitis    chronic, with nasal obstruction,tinnitis, and hearing loss  . Sleep apnea    NO CPAP    There are no active problems to display for this patient.   Past Surgical History:  Procedure Laterality Date  . ABDOMINAL HYSTERECTOMY    . BREAST BIOPSY Bilateral 1980's   sugical bx   . CARDIAC CATHETERIZATION     YEARS AGO  . CHOLECYSTECTOMY    . COLONOSCOPY WITH PROPOFOL N/A 07/04/2015   Procedure: COLONOSCOPY WITH PROPOFOL;  Surgeon: Manya Silvas, MD;  Location: [facility] ENDOSCOPY;  Service: Endoscopy;  Laterality: N/A;  . ETHMOIDECTOMY Left 03/13/2017   Procedure: ANTERIOR ETHMOIDECTOMY WITH FRONTAL SINUSOTOMY;  Surgeon: Carloyn Manner, MD;  Location: [place];  Service: ENT;  Laterality: Left;  . IMAGE GUIDED SINUS SURGERY Left 03/13/2017   Procedure: IMAGE GUIDED SINUS SURGERY;  Surgeon: Carloyn Manner, MD;  Location: [place];  Service: ENT;  Laterality: Left;  NEED STRYKER DISK DIABETIC/ SLEEP APNEA-no CPAP GAVE DISK TO CECE 2-28  . JOINT REPLACEMENT Left 2005   knee  . MAXILLARY ANTROSTOMY Left 03/13/2017   Procedure: MAXILLARY ANTROSTOMY WITH TISSUE REMOVAL;  Surgeon: Carloyn Manner, MD;  Location: [name];  Service: ENT;  Laterality: Left;    Allergies Keflex [cephalexin] and Clindamycin/lincomycin  Social History Social History   Tobacco Use  . Smoking status: Never Smoker  . Smokeless tobacco: Never Used  Substance Use Topics  . Alcohol use: No    Frequency: Never  . Drug use: No   Review of Systems Constitutional: Negative for fever. Cardiovascular: Negative for chest pain. Respiratory: Negative for shortness of breath. Gastrointestinal: Negative for abdominal pain, positive for frequent vomiting Musculoskeletal: Negative for back pain. Skin: Negative for rash. Neurological: Positive for weakness  All systems negative/normal/unremarkable except as stated in the HPI  ____________________________________________   PHYSICAL EXAM:  VITAL SIGNS: ED Triage Vitals [07/08/17 0855]  Enc Vitals Group     BP (!) 144/94     Pulse Rate 85     Resp 14     Temp (!) 97.5 F (36.4 C)     Temp Source Oral     SpO2 100 %     Weight 155 lb (70.3 kg)     Height 5\' 5"  (1.651 m)     Head Circumference      Peak Flow  Pain Score 0     Pain Loc      Pain Edu?      Excl. in [place]?    Constitutional: Alert and oriented. Well appearing and in no distress. Eyes: Conjunctivae are normal. Normal extraocular movements. ENT   Head: Normocephalic and atraumatic.   Nose: No congestion/rhinnorhea.   Mouth/Throat: Mucous membranes are dry   Neck: No stridor. Cardiovascular: Normal rate, regular rhythm. No murmurs, rubs, or gallops. Respiratory: Normal respiratory effort without tachypnea nor retractions. Breath sounds are clear and equal bilaterally. No  wheezes/rales/rhonchi. Gastrointestinal: Soft and nontender. Normal bowel sounds Musculoskeletal: Nontender with normal range of motion in extremities. No lower extremity tenderness nor edema. Neurologic:  Normal speech and language. No gross focal neurologic deficits are appreciated.  Skin:  Skin is warm, dry and intact. No rash noted. Psychiatric: Mood and affect are normal. Speech and behavior are normal.  ____________________________________________  EKG: Interpreted by me.  Sinus rhythm rate 81 bpm, LVH, left axis deviation, normal QT  ____________________________________________  ED COURSE:  As part of my medical decision making, I reviewed the following data within the [place] History obtained from family if available, nursing notes, old chart and ekg, as well as notes from prior ED visits. Patient presented for persistent vomiting with likely dehydration, we will assess with labs and imaging as indicated at this time.   Procedures ____________________________________________   LABS (pertinent positives/negatives)  Labs Reviewed  CBC WITH DIFFERENTIAL/PLATELET - Abnormal; Notable for the following components:      Result Value   RDW 14.6 (*)    All other components within normal limits  COMPREHENSIVE METABOLIC PANEL - Abnormal; Notable for the following components:   Glucose, Bld 110 (*)    BUN 29 (*)    Creatinine, Ser 1.64 (*)    Total Protein 8.2 (*)    Alkaline Phosphatase 219 (*)    GFR calc non Af Amer 30 (*)    GFR calc Af Amer 35 (*)    All other components within normal limits  URINALYSIS, COMPLETE (UACMP) WITH MICROSCOPIC - Abnormal; Notable for the following components:   Color, Urine YELLOW (*)    APPearance HAZY (*)    Leukocytes, UA LARGE (*)    Bacteria, UA MANY (*)    All other components within normal limits  URINE CULTURE  TROPONIN I   RADIOLOGY Renal US is  unremarkable ___________________________________________  DIFFERENTIAL DIAGNOSIS   Dehydration, electrolyte abnormality, renal failure, UTI  FINAL ASSESSMENT AND PLAN  Acute renal failure   Plan: The patient had presented for persistent vomiting for several weeks. Patient's labs did reflect some dehydration with an elevated BUN and creatinine.  Her white blood cell count was normal but her urine appeared infected.  We did send a urine culture and gave IV Rocephin.  She also received 2 L of IV fluid. Patient's imaging is unremarkable   Laurence Aly, MD   Note: This note was generated in part or whole with voice recognition software. Voice recognition is usually quite accurate but there are transcription errors that can and very often do occur. I apologize for any typographical errors that were not detected and corrected.     Earleen Newport, MD 07/08/17 [phone]

## 2017-07-10 LAB — URINE CULTURE
Culture: >=100000 — AB
Special Requests: NORMAL

## 2017-08-18 ENCOUNTER — Emergency Department
Admission: EM | Admit: 2017-08-18 | Discharge: 2017-08-18 | Disposition: A | Discharge status: Home / Self Care | Payer: Medicare Other | Attending: Emergency Medicine | Admitting: Emergency Medicine

## 2017-08-18 ENCOUNTER — Other Ambulatory Visit: Payer: Self-pay

## 2017-08-18 ENCOUNTER — Emergency Department: Payer: Medicare Other

## 2017-08-18 ENCOUNTER — Encounter: Payer: Self-pay | Admitting: Emergency Medicine

## 2017-08-18 DIAGNOSIS — E039 Hypothyroidism, unspecified: Secondary | ICD-10-CM | POA: Insufficient documentation

## 2017-08-18 DIAGNOSIS — Y929 Unspecified place or not applicable: Secondary | ICD-10-CM | POA: Diagnosis not present

## 2017-08-18 DIAGNOSIS — W19XXXA Unspecified fall, initial encounter: Secondary | ICD-10-CM

## 2017-08-18 DIAGNOSIS — Z79899 Other long term (current) drug therapy: Secondary | ICD-10-CM | POA: Diagnosis not present

## 2017-08-18 DIAGNOSIS — Y999 Unspecified external cause status: Secondary | ICD-10-CM | POA: Insufficient documentation

## 2017-08-18 DIAGNOSIS — S0990XA Unspecified injury of head, initial encounter: Secondary | ICD-10-CM | POA: Diagnosis not present

## 2017-08-18 DIAGNOSIS — R51 Headache: Secondary | ICD-10-CM | POA: Insufficient documentation

## 2017-08-18 DIAGNOSIS — I10 Essential (primary) hypertension: Secondary | ICD-10-CM | POA: Diagnosis not present

## 2017-08-18 DIAGNOSIS — Y939 Activity, unspecified: Secondary | ICD-10-CM | POA: Diagnosis not present

## 2017-08-18 DIAGNOSIS — E119 Type 2 diabetes mellitus without complications: Secondary | ICD-10-CM | POA: Insufficient documentation

## 2017-08-18 DIAGNOSIS — W01198A Fall on same level from slipping, tripping and stumbling with subsequent striking against other object, initial encounter: Secondary | ICD-10-CM | POA: Diagnosis not present

## 2017-08-18 MED ORDER — TRAMADOL HCL 50 MG PO TABS: 50.0000 mg | ORAL_TABLET | Freq: Four times a day (QID) | ORAL | 0 refills | Status: AC | PRN

## 2017-08-18 NOTE — ED Provider Notes (Signed)
[facility] Emergency Department Provider Note  ____________________________________________  Time seen: Approximately 12:15 PM  I have reviewed the triage vital signs and the nursing notes.   HISTORY  Chief Complaint Fall    HPI Kelly Wells is a 72 y.o. female that presents to the emergency department for evaluation of headache after falling this morning.  Patient states that she missed a step and hit the back of her head on concrete.  She did not lose consciousness.  She does not take any blood thinners. She had a stroke in March. She denies dizziness, confusion, visual changes, weakness.   Past Medical History:  Diagnosis Date  . Anemia   . Anxiety   . Arthritis    KNEES,HANDS  . Depression   . Diabetes mellitus without complication ([place])    type 2  . Headache    SINUS ISSUES  . Hyperlipidemia   . Hypertension   . Hypothyroidism   . Sinusitis    chronic, with nasal obstruction,tinnitis, and hearing loss  . Sleep apnea    NO CPAP    There are no active problems to display for this patient.   Past Surgical History:  Procedure Laterality Date  . ABDOMINAL HYSTERECTOMY    . BREAST BIOPSY Bilateral 1980's   sugical bx   . CARDIAC CATHETERIZATION     YEARS AGO  . CHOLECYSTECTOMY    . COLONOSCOPY WITH PROPOFOL N/A 07/04/2015   Procedure: COLONOSCOPY WITH PROPOFOL;  Surgeon: Manya Silvas, MD;  Location: [facility] ENDOSCOPY;  Service: Endoscopy;  Laterality: N/A;  . ETHMOIDECTOMY Left 03/13/2017   Procedure: ANTERIOR ETHMOIDECTOMY WITH FRONTAL SINUSOTOMY;  Surgeon: Carloyn Manner, MD;  Location: [place];  Service: ENT;  Laterality: Left;  . IMAGE GUIDED SINUS SURGERY Left 03/13/2017   Procedure: IMAGE GUIDED SINUS SURGERY;  Surgeon: Carloyn Manner, MD;  Location: [place];  Service: ENT;  Laterality: Left;  NEED STRYKER DISK DIABETIC/ SLEEP APNEA-no CPAP GAVE DISK TO CECE 2-28  . JOINT REPLACEMENT Left 2005   knee  .  MAXILLARY ANTROSTOMY Left 03/13/2017   Procedure: MAXILLARY ANTROSTOMY WITH TISSUE REMOVAL;  Surgeon: Carloyn Manner, MD;  Location: [place];  Service: ENT;  Laterality: Left;    Prior to Admission medications   Medication Sig Start Date End Date Taking? Authorizing Provider  clonazePAM (KLONOPIN) 0.5 MG tablet Take 0.5 mg by mouth 2 (two) times daily as needed for anxiety.    [provider]  cyclobenzaprine (FLEXERIL) 5 MG tablet Take 1 tablet (5 mg total) by mouth 3 (three) times daily as needed for muscle spasms. 03/21/17   Johnn Hai, PA-C  dimenhyDRINATE (DRAMAMINE) 50 MG tablet Take 50 mg by mouth daily as needed for nausea.    [provider]  Exenatide ER (BYDUREON) 2 MG PEN Inject 2 mg into the skin every 7 (seven) days. Every Monday    [provider]  FLUoxetine (PROZAC) 40 MG capsule Take 40 mg by mouth daily. AM    [provider]  loperamide (IMODIUM A-D) 2 MG tablet Take 2 mg by mouth 4 (four) times daily as needed for diarrhea or loose stools.    [provider]  meloxicam (MOBIC) 15 MG tablet Take 1 tablet (15 mg total) by mouth daily. 03/21/17 03/21/18  Johnn Hai, PA-C  metFORMIN (GLUCOPHAGE) 1000 MG tablet Take 1,000 mg by mouth 2 (two) times daily with a meal.    [provider]  omeprazole (PRILOSEC) 20  MG capsule Take 20 mg by mouth daily.     [provider]  ondansetron (ZOFRAN ODT) 4 MG disintegrating tablet Take 1 tablet (4 mg total) by mouth every 8 (eight) hours as needed for nausea or vomiting. 07/08/17   Earleen Newport, MD  ondansetron (ZOFRAN) 4 MG tablet Take 1 tablet (4 mg total) by mouth every 8 (eight) hours as needed for up to 10 doses for nausea or vomiting. Patient not taking: Reported on 07/08/2017 03/13/17   Carloyn Manner, MD  oxyCODONE-acetaminophen (PERCOCET) 5-325 MG tablet Take 1 tablet by mouth every 6 (six) hours as needed for severe pain. Patient not taking:  Reported on 07/08/2017 03/21/17   Johnn Hai, PA-C  promethazine (PHENERGAN) 25 MG tablet Take 25 mg by mouth every 6 (six) hours as needed for nausea. 07/05/17 07/12/17  [provider]  traMADol (ULTRAM) 50 MG tablet Take 1 tablet (50 mg total) by mouth every 6 (six) hours as needed. 08/18/17 08/18/18  Laban Emperor, PA-C    Allergies Keflex [cephalexin] and Clindamycin/lincomycin  Family History  Problem Relation Age of Onset  . Stroke Mother     Social History Social History   Tobacco Use  . Smoking status: Never Smoker  . Smokeless tobacco: Never Used  Substance Use Topics  . Alcohol use: No    Frequency: Never  . Drug use: No     Review of Systems  Cardiovascular: No chest pain. Respiratory: No SOB. Gastrointestinal: No abdominal pain.  No nausea, no vomiting.  Musculoskeletal: Negative for musculoskeletal pain. Skin: Negative for rash, abrasions, lacerations, ecchymosis. Neurological: Negative for numbness or tingling. Positive for headache.   ____________________________________________   PHYSICAL EXAM:  VITAL SIGNS: ED Triage Vitals  Enc Vitals Group     BP 08/18/17 1022 (!) 161/90     Pulse Rate 08/18/17 1022 96     Resp 08/18/17 1022 18     Temp 08/18/17 1022 98.7 F (37.1 C)     Temp Source 08/18/17 1022 Oral     SpO2 08/18/17 1022 99 %     Weight 08/18/17 1023 161 lb 9 oz (73.3 kg)     Height 08/18/17 1023 5\' 5"  (1.651 m)     Head Circumference --      Peak Flow --      Pain Score 08/18/17 1031 10     Pain Loc --      Pain Edu? --      Excl. in [place]? --      Constitutional: Alert and oriented. Well appearing and in no acute distress. Eyes: Conjunctivae are normal. PERRL. EOMI. mild left upper eyelid droop, which patient says is chronic. Head: Small hematoma to posterior scalp. ENT:      Ears:      Nose: No congestion/rhinnorhea.      Mouth/Throat: Mucous membranes are moist.  Neck: No stridor. No cervical spine tenderness to  palpation. Cardiovascular: Normal rate, regular rhythm.  Good peripheral circulation. Respiratory: Normal respiratory effort without tachypnea or retractions. Lungs CTAB. Good air entry to the bases with no decreased or absent breath sounds. Musculoskeletal: Full range of motion to all extremities. No gross deformities appreciated. Neurologic: Normal speech and language. No gross focal neurologic deficits are appreciated.  Cranial nerves: 2-10 normal as tested. Strength 5/5 in upper and lower extremities Cerebellar: Finger-nose-finger WNL, Heel to shin WNL Sensorimotor: No pronator drift, clonus, sensory loss or abnormal reflexes.  Speech: No dysarthria or expressive aphasia Skin:  Skin  is warm, dry and intact. No rash noted. Psychiatric: Mood and affect are normal. Speech and behavior are normal. Patient exhibits appropriate insight and judgement.   ____________________________________________   LABS (all labs ordered are listed, but only abnormal results are displayed)  Labs Reviewed - No data to display ____________________________________________  EKG   ____________________________________________  RADIOLOGY Robinette Haines, personally viewed and evaluated these images (plain radiographs) as part of my medical decision making, as well as reviewing the written report by the radiologist.  Ct Head Wo Contrast  Result Date: 08/18/2017 CLINICAL DATA:  Headache and neck pain following a fall with a left parietal injury. EXAM: CT HEAD WITHOUT CONTRAST CT CERVICAL SPINE WITHOUT CONTRAST TECHNIQUE: Multidetector CT imaging of the head and cervical spine was performed following the standard protocol without intravenous contrast. Multiplanar CT image reconstructions of the cervical spine were also generated. COMPARISON:  Head and cervical spine CT dated 03/21/2017. FINDINGS: CT HEAD FINDINGS Brain: Stable mild-to-moderate diffuse enlargement of the ventricles and subarachnoid spaces. Stable  mild to moderate patchy white matter low density in both cerebral hemispheres. Stable old left cerebellar hemisphere infarct or focally prominent subarachnoid space. No intracranial hemorrhage, mass lesion or CT evidence of acute infarction. Vascular: No hyperdense vessel or unexpected calcification. Skull: Normal. Negative for fracture or focal lesion. Sinuses/Orbits: Unremarkable. Other: Left posterior parietal scalp hematoma. CT CERVICAL SPINE FINDINGS Alignment: Stable minimal anterolisthesis at the C5-6 level. Skull base and vertebrae: No acute fracture. No primary bone lesion or focal pathologic process. Soft tissues and spinal canal: No prevertebral fluid or swelling. No visible canal hematoma. Disc levels: Mild multilevel degenerative changes, most pronounced at the C5-6 level. Upper chest: Clear lung apices. Other: Bilateral carotid artery calcifications. IMPRESSION: 1. Left posterior parietal scalp hematoma without skull fracture or intracranial hemorrhage. 2. No cervical spine fracture or subluxation. 3. Stable mild to moderate diffuse cerebral and cerebellar atrophy and mild to moderate chronic small vessel white matter ischemic changes in both cerebral hemispheres. 4. Stable old left cerebellar hemisphere infarct or focally prominent subarachnoid space/arachnoid cyst. 5. Mild cervical spine degenerative changes. 6. Bilateral carotid artery atheromatous calcifications. Electronically Signed   By: Claudie Revering M.D.   On: 08/18/2017 12:12   Ct Cervical Spine Wo Contrast  Result Date: 08/18/2017 CLINICAL DATA:  Headache and neck pain following a fall with a left parietal injury. EXAM: CT HEAD WITHOUT CONTRAST CT CERVICAL SPINE WITHOUT CONTRAST TECHNIQUE: Multidetector CT imaging of the head and cervical spine was performed following the standard protocol without intravenous contrast. Multiplanar CT image reconstructions of the cervical spine were also generated. COMPARISON:  Head and cervical spine CT  dated 03/21/2017. FINDINGS: CT HEAD FINDINGS Brain: Stable mild-to-moderate diffuse enlargement of the ventricles and subarachnoid spaces. Stable mild to moderate patchy white matter low density in both cerebral hemispheres. Stable old left cerebellar hemisphere infarct or focally prominent subarachnoid space. No intracranial hemorrhage, mass lesion or CT evidence of acute infarction. Vascular: No hyperdense vessel or unexpected calcification. Skull: Normal. Negative for fracture or focal lesion. Sinuses/Orbits: Unremarkable. Other: Left posterior parietal scalp hematoma. CT CERVICAL SPINE FINDINGS Alignment: Stable minimal anterolisthesis at the C5-6 level. Skull base and vertebrae: No acute fracture. No primary bone lesion or focal pathologic process. Soft tissues and spinal canal: No prevertebral fluid or swelling. No visible canal hematoma. Disc levels: Mild multilevel degenerative changes, most pronounced at the C5-6 level. Upper chest: Clear lung apices. Other: Bilateral carotid artery calcifications. IMPRESSION: 1. Left posterior parietal scalp hematoma without  skull fracture or intracranial hemorrhage. 2. No cervical spine fracture or subluxation. 3. Stable mild to moderate diffuse cerebral and cerebellar atrophy and mild to moderate chronic small vessel white matter ischemic changes in both cerebral hemispheres. 4. Stable old left cerebellar hemisphere infarct or focally prominent subarachnoid space/arachnoid cyst. 5. Mild cervical spine degenerative changes. 6. Bilateral carotid artery atheromatous calcifications. Electronically Signed   By: Claudie Revering M.D.   On: 08/18/2017 12:12    ____________________________________________    PROCEDURES  Procedure(s) performed:    Procedures    Medications - No data to display   ____________________________________________   INITIAL IMPRESSION / ASSESSMENT AND PLAN / ED COURSE  Pertinent labs & imaging results that were available during my  care of the patient were reviewed by me and considered in my medical decision making (see chart for details).  Review of the [place] CSRS was performed in accordance of the [place] prior to dispensing any controlled drugs.    Patient presented to the emergency department for evaluation of head injury today.  Vital signs and exam are reassuring.  CT head and cervical are negative for acute abnormalities.  All findings were discussed with patient.  Neuro exam is unremarkable.  Patient will be discharged home with prescriptions for tramadol. Patient is to follow up with PCP as directed. Patient is given ED precautions to return to the ED for any worsening or new symptoms.     ____________________________________________  FINAL CLINICAL IMPRESSION(S) / ED DIAGNOSES  Final diagnoses:  Fall, initial encounter  Injury of head, initial encounter      NEW MEDICATIONS STARTED DURING THIS VISIT:  ED Discharge Orders         Ordered    traMADol (ULTRAM) 50 MG tablet  Every 6 hours PRN     08/18/17 1300              This chart was dictated using voice recognition software/Dragon. Despite best efforts to proofread, errors can occur which can change the meaning. Any change was purely unintentional.    Laban Emperor, PA-C 08/18/17 1523    Nena Polio, MD 08/18/17 [phone]

## 2017-08-18 NOTE — ED Triage Notes (Signed)
States she missed a step  And fell  Having headache  States her body is sore  Denies any loc

## 2018-02-11 ENCOUNTER — Other Ambulatory Visit: Payer: Self-pay | Admitting: Emergency Medicine

## 2018-05-23 ENCOUNTER — Other Ambulatory Visit: Payer: Self-pay | Admitting: Family Medicine

## 2018-05-28 ENCOUNTER — Other Ambulatory Visit: Payer: Self-pay | Admitting: Family Medicine

## 2018-05-28 DIAGNOSIS — Z1231 Encounter for screening mammogram for malignant neoplasm of breast: Secondary | ICD-10-CM

## 2018-06-13 ENCOUNTER — Ambulatory Visit: Payer: Medicare Other

## 2018-06-30 ENCOUNTER — Ambulatory Visit
Admission: RE | Admit: 2018-06-30 | Discharge: 2018-06-30 | Disposition: A | Discharge status: Home / Self Care | Payer: Medicare Other | Source: Ambulatory Visit | Attending: Family Medicine | Admitting: Family Medicine

## 2018-06-30 ENCOUNTER — Other Ambulatory Visit: Payer: Self-pay

## 2018-06-30 DIAGNOSIS — Z1231 Encounter for screening mammogram for malignant neoplasm of breast: Secondary | ICD-10-CM | POA: Diagnosis present

## 2018-10-31 ENCOUNTER — Other Ambulatory Visit (HOSPITAL_COMMUNITY): Payer: Self-pay | Admitting: Family Medicine

## 2018-10-31 ENCOUNTER — Other Ambulatory Visit: Payer: Self-pay | Admitting: Family Medicine

## 2018-10-31 DIAGNOSIS — R748 Abnormal levels of other serum enzymes: Secondary | ICD-10-CM

## 2018-11-04 ENCOUNTER — Ambulatory Visit
Admission: RE | Admit: 2018-11-04 | Discharge: 2018-11-04 | Disposition: A | Discharge status: Home / Self Care | Payer: Medicare Other | Source: Ambulatory Visit | Attending: Family Medicine | Admitting: Family Medicine

## 2018-11-04 ENCOUNTER — Other Ambulatory Visit: Payer: Self-pay

## 2018-11-04 DIAGNOSIS — R748 Abnormal levels of other serum enzymes: Secondary | ICD-10-CM | POA: Diagnosis present

## 2018-11-17 ENCOUNTER — Other Ambulatory Visit: Payer: Self-pay | Admitting: Family Medicine

## 2018-11-17 DIAGNOSIS — K838 Other specified diseases of biliary tract: Secondary | ICD-10-CM

## 2018-11-26 ENCOUNTER — Ambulatory Visit
Admission: RE | Admit: 2018-11-26 | Discharge: 2018-11-26 | Disposition: A | Discharge status: Home / Self Care | Payer: Medicare Other | Source: Ambulatory Visit | Attending: Family Medicine | Admitting: Family Medicine

## 2018-11-26 ENCOUNTER — Other Ambulatory Visit: Payer: Self-pay

## 2018-11-26 ENCOUNTER — Other Ambulatory Visit: Payer: Self-pay | Admitting: Family Medicine

## 2018-11-26 DIAGNOSIS — K838 Other specified diseases of biliary tract: Secondary | ICD-10-CM | POA: Diagnosis present

## 2018-11-26 MED ORDER — GADOBUTROL 1 MMOL/ML IV SOLN
7.0000 mL | Freq: Once | INTRAVENOUS | Status: AC | PRN
  Administered 2018-11-26: 7 mL via INTRAVENOUS

## 2019-01-16 ENCOUNTER — Other Ambulatory Visit: Payer: Self-pay | Admitting: Acute Care

## 2019-01-16 DIAGNOSIS — M5416 Radiculopathy, lumbar region: Secondary | ICD-10-CM

## 2019-01-27 ENCOUNTER — Other Ambulatory Visit: Payer: Self-pay

## 2019-01-27 ENCOUNTER — Ambulatory Visit
Admission: RE | Admit: 2019-01-27 | Discharge: 2019-01-27 | Disposition: A | Discharge status: Home / Self Care | Payer: Medicare Other | Source: Ambulatory Visit | Attending: Acute Care | Admitting: Acute Care

## 2019-01-27 DIAGNOSIS — M5416 Radiculopathy, lumbar region: Secondary | ICD-10-CM | POA: Diagnosis present

## 2019-01-28 ENCOUNTER — Ambulatory Visit
Admission: RE | Admit: 2019-01-28 | Discharge: 2019-01-28 | Disposition: A | Discharge status: Home / Self Care | Payer: Medicare Other | Source: Ambulatory Visit | Attending: Neurosurgery | Admitting: Neurosurgery

## 2019-01-28 ENCOUNTER — Other Ambulatory Visit: Payer: Self-pay | Admitting: Neurosurgery

## 2019-01-28 DIAGNOSIS — S32010A Wedge compression fracture of first lumbar vertebra, initial encounter for closed fracture: Secondary | ICD-10-CM

## 2019-01-28 DIAGNOSIS — G959 Disease of spinal cord, unspecified: Secondary | ICD-10-CM

## 2019-02-10 ENCOUNTER — Other Ambulatory Visit: Payer: Self-pay

## 2019-02-10 ENCOUNTER — Ambulatory Visit
Admission: RE | Admit: 2019-02-10 | Discharge: 2019-02-10 | Disposition: A | Discharge status: Home / Self Care | Payer: Medicare Other | Source: Ambulatory Visit | Attending: Neurosurgery | Admitting: Neurosurgery

## 2019-02-10 DIAGNOSIS — G959 Disease of spinal cord, unspecified: Secondary | ICD-10-CM | POA: Diagnosis not present

## 2019-08-05 ENCOUNTER — Other Ambulatory Visit: Payer: Self-pay | Admitting: Family Medicine

## 2019-08-05 DIAGNOSIS — Z1231 Encounter for screening mammogram for malignant neoplasm of breast: Secondary | ICD-10-CM

## 2019-09-03 ENCOUNTER — Ambulatory Visit
Admission: RE | Admit: 2019-09-03 | Discharge: 2019-09-03 | Disposition: A | Discharge status: Home / Self Care | Payer: Medicare Other | Source: Ambulatory Visit | Attending: Family Medicine | Admitting: Family Medicine

## 2019-09-03 ENCOUNTER — Other Ambulatory Visit: Payer: Self-pay

## 2019-09-03 DIAGNOSIS — Z1231 Encounter for screening mammogram for malignant neoplasm of breast: Secondary | ICD-10-CM | POA: Diagnosis not present

## 2019-11-05 ENCOUNTER — Other Ambulatory Visit: Payer: Self-pay | Admitting: Acute Care

## 2019-11-05 DIAGNOSIS — I639 Cerebral infarction, unspecified: Secondary | ICD-10-CM

## 2019-11-23 ENCOUNTER — Ambulatory Visit
Admission: RE | Admit: 2019-11-23 | Discharge: 2019-11-23 | Disposition: A | Discharge status: Home / Self Care | Payer: Medicare Other | Source: Ambulatory Visit | Attending: Acute Care | Admitting: Acute Care

## 2019-11-23 ENCOUNTER — Other Ambulatory Visit: Payer: Self-pay

## 2019-11-23 DIAGNOSIS — I639 Cerebral infarction, unspecified: Secondary | ICD-10-CM | POA: Insufficient documentation

## 2020-02-03 IMAGING — CT CT L SPINE W/O CM
3 series · 13 of 33 positions shown, 16 images · non-contrast
Comparison: 03/21/2017 lumbar spine radiograph. 06/04/2005 lumbar
spine MRI.

CLINICAL DATA: 72 y/o F; status post fall on [REDACTED] now with
difficulty bearing weight. Abnormal lumbar spine radiograph.

EXAM:
CT LUMBAR SPINE WITHOUT CONTRAST
TECHNIQUE: Multidetector CT imaging of the lumbar spine was performed without
intravenous contrast administration. Multiplanar CT image
reconstructions were also generated.

[Series 5: l spine soft · axial · 0.38mm/px · z∈[-952,-800]mm · 5 of 112 slices shown, 7 images]
[im 18/112  soft-tissue]
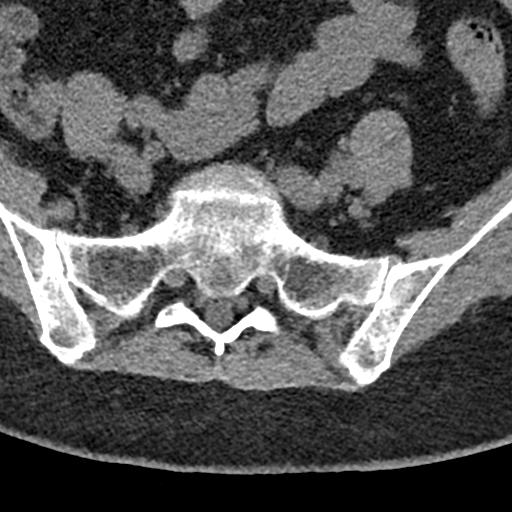
[im 18/112  bone]
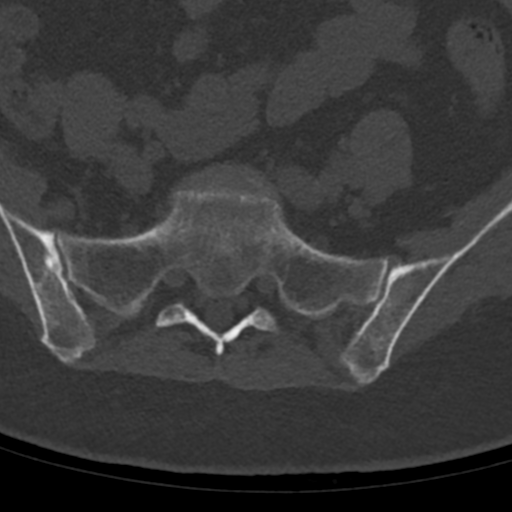
[im 35/112  bone]
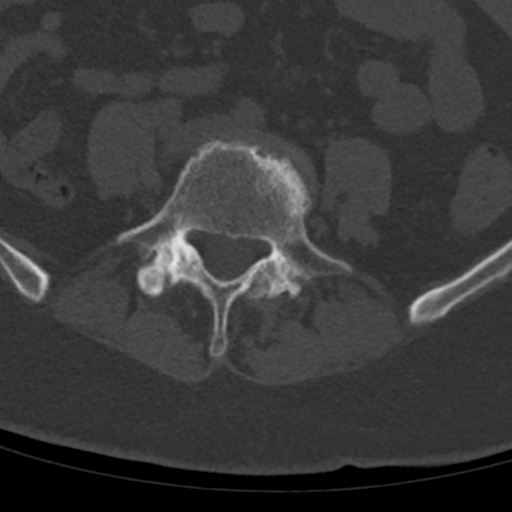
[im 60/112  bone]
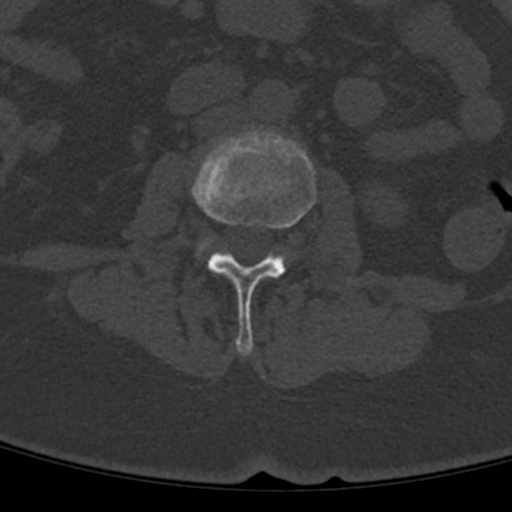
[im 77/112  bone]
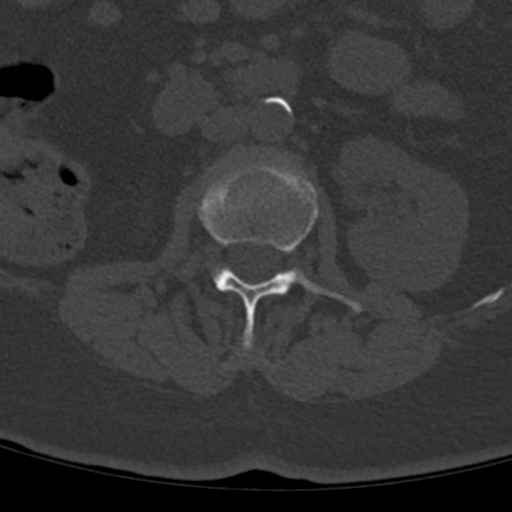
[im 94/112  soft-tissue]
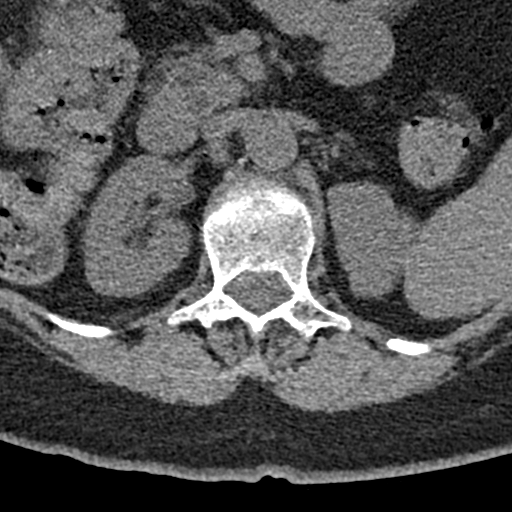
[im 94/112  bone]
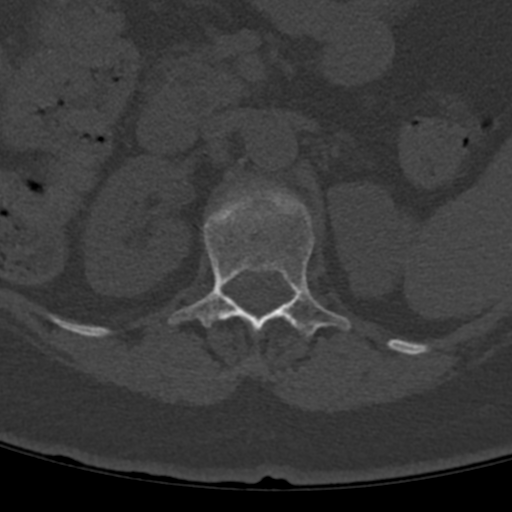

[Series 6: sagittal bone · sagittal · 0.33mm/px · 5 of 80 slices shown, 6 images]
[im 27/80  bone]
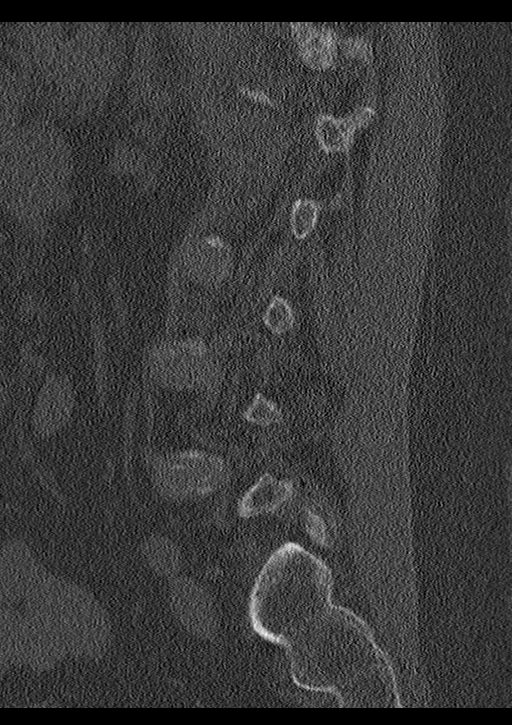
[im 33/80  bone]
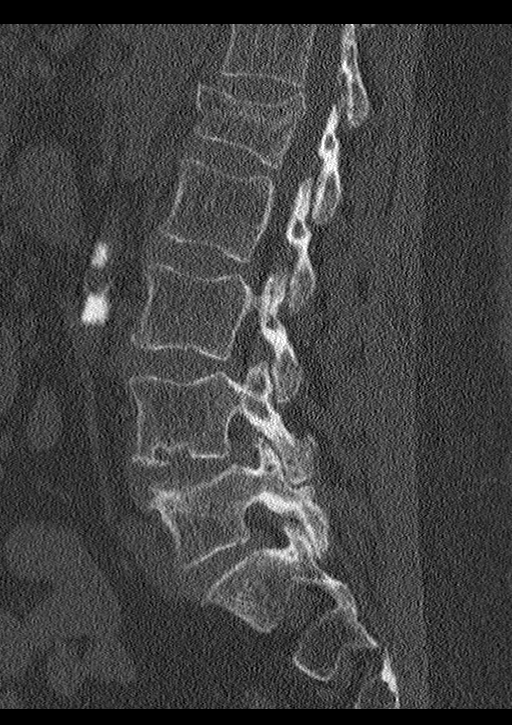
[im 40/80  soft-tissue]
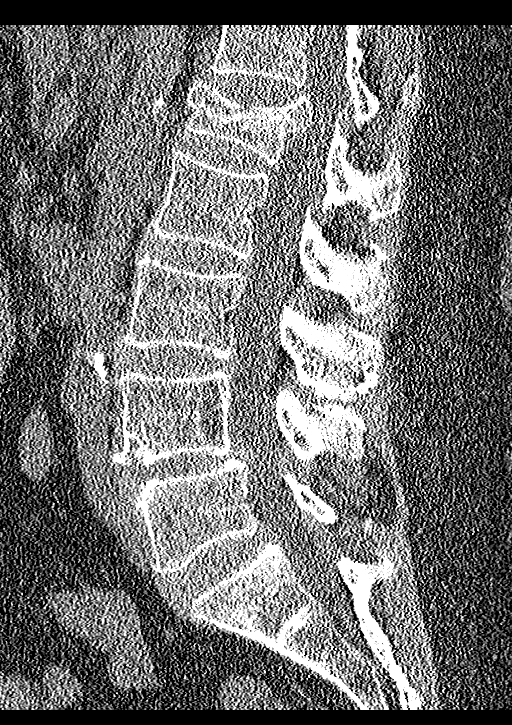
[im 40/80  bone]
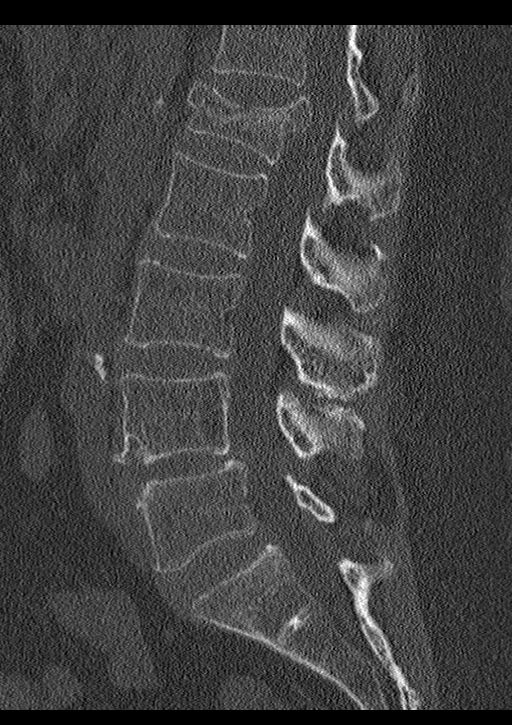
[im 47/80  bone]
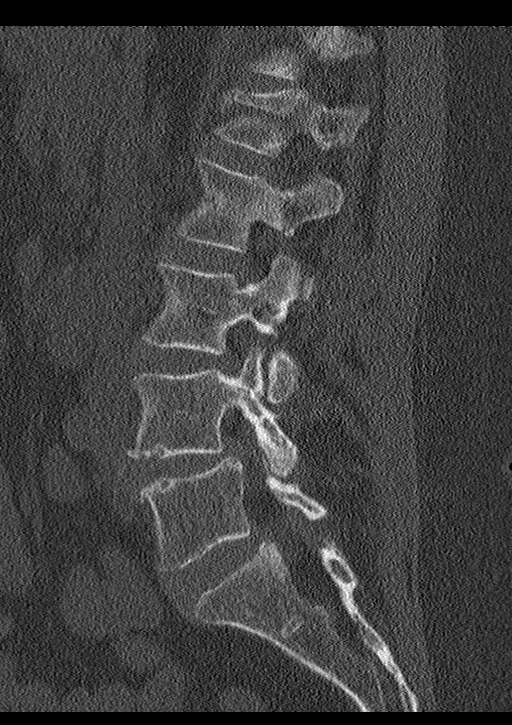
[im 53/80  bone]
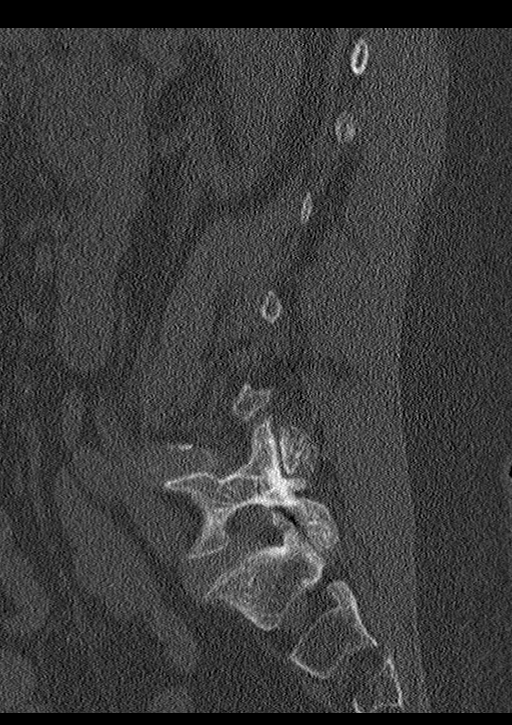

[Series 7: coronal bone · coronal · 0.41mm/px · 3 of 73 slices shown]
[im 15/73  bone]
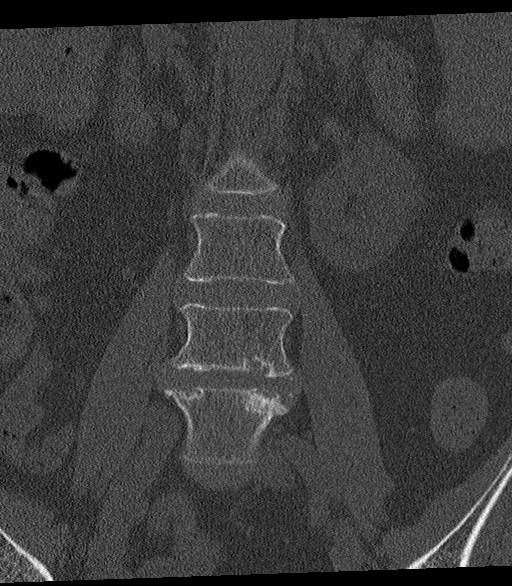
[im 29/73  bone]
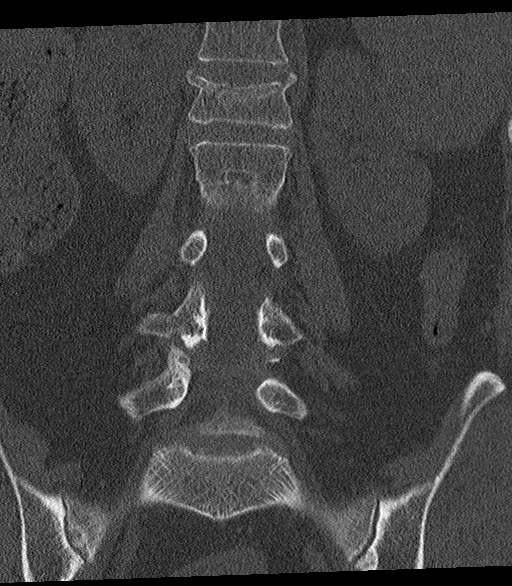
[im 44/73  bone]
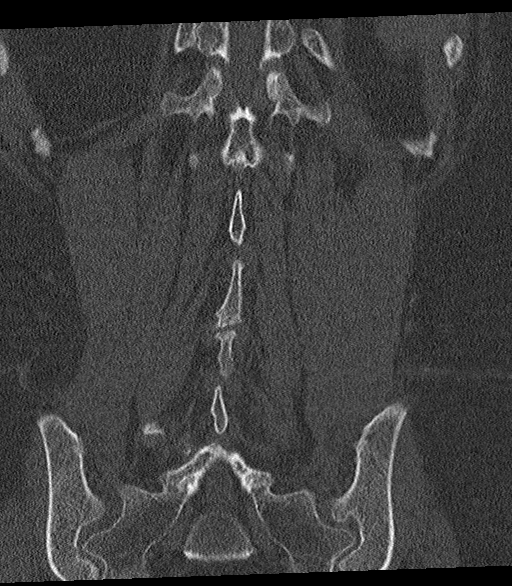

[13 of 33 positions shown; findings below may reference images not displayed]

FINDINGS: Segmentation: 5 lumbar type vertebrae.

Alignment: L4-5 grade 1 anterolisthesis.

Vertebrae: L1 superior endplate fracture involving anterior and
middle columns with 50% loss of vertebral body height and 6 mm
retropulsion of superior endplate. Fracture lines are persistent
indicating recent injury. No other acute fracture identified.

Paraspinal and other soft tissues: Mild paravertebral edema at level
of T1 fracture. Aortic atherosclerosis.

Disc levels: Multilevel disc bulges and facet hypertrophy resulting
in mild foraminal stenosis bilaterally at L4-5 and L5-S1 levels.
Retropulsion of L1 superior endplate results in mild bony spinal
canal stenosis. At L4-5 anterolisthesis and uncovered disc bulge
combined with ligamentum flavum hypertrophy results in moderate to
severe canal stenosis.
IMPRESSION: 1. L1 superior endplate fracture involving anterior and middle
columns with 50% loss of vertebral body height and 6 mm retropulsion
of superior endplate. Fracture lines are persistent indicating
recent injury. No other acute fracture identified.
2. Retropulsion of L1 superior endplate results in mild bony spinal
canal stenosis.
3. L4-5 multifactorial moderate to severe canal stenosis.

By: Tripti Tiger M.D.

## 2020-02-03 IMAGING — CR DG LUMBAR SPINE 2-3V
1 series · 3 of 3 positions shown · non-contrast
Comparison: Lumbar spine MRI dated 06/04/2005

CLINICAL DATA: 72-year-old female with lower back pain.

EXAM:
LUMBAR SPINE - 2-3 VIEW

[Series 1: dg lumbar spine 2-3 views · 0.14mm/px · 3 of 3 slices shown]
[im 1/3]
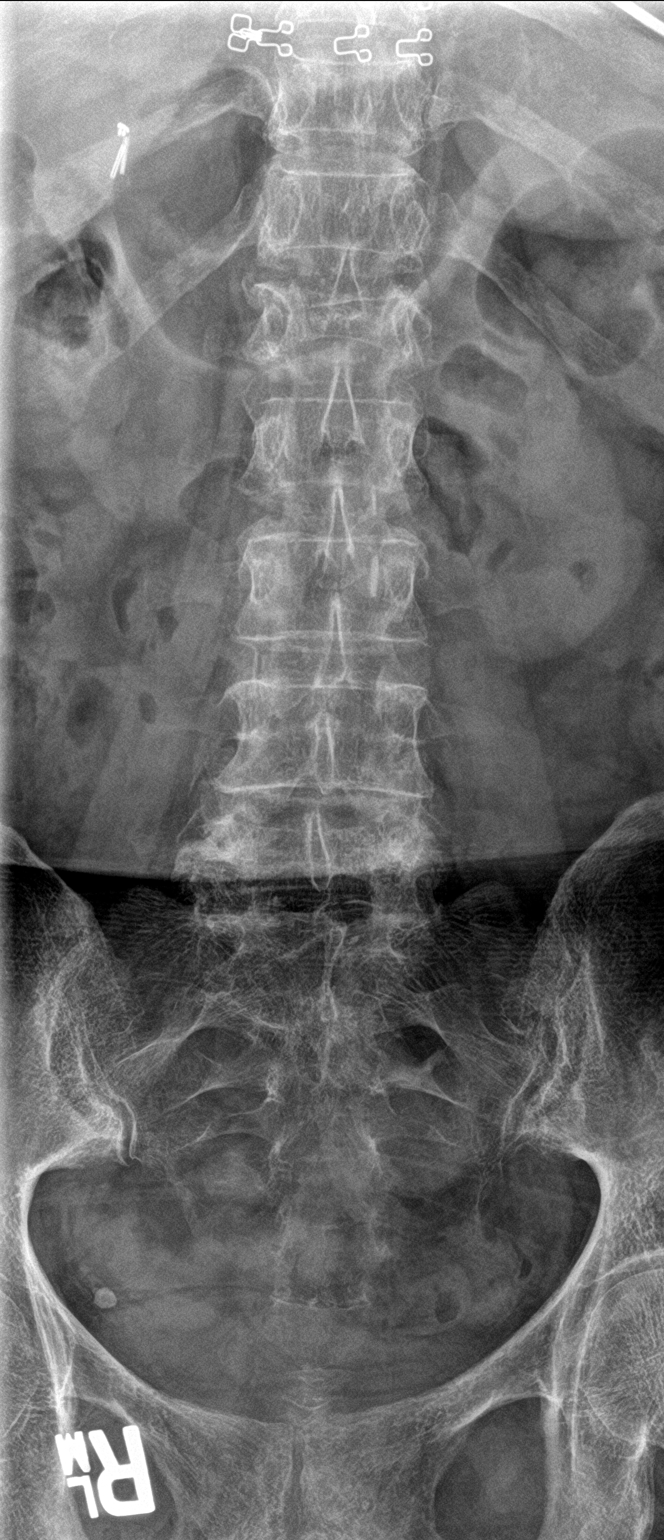
[im 2/3]
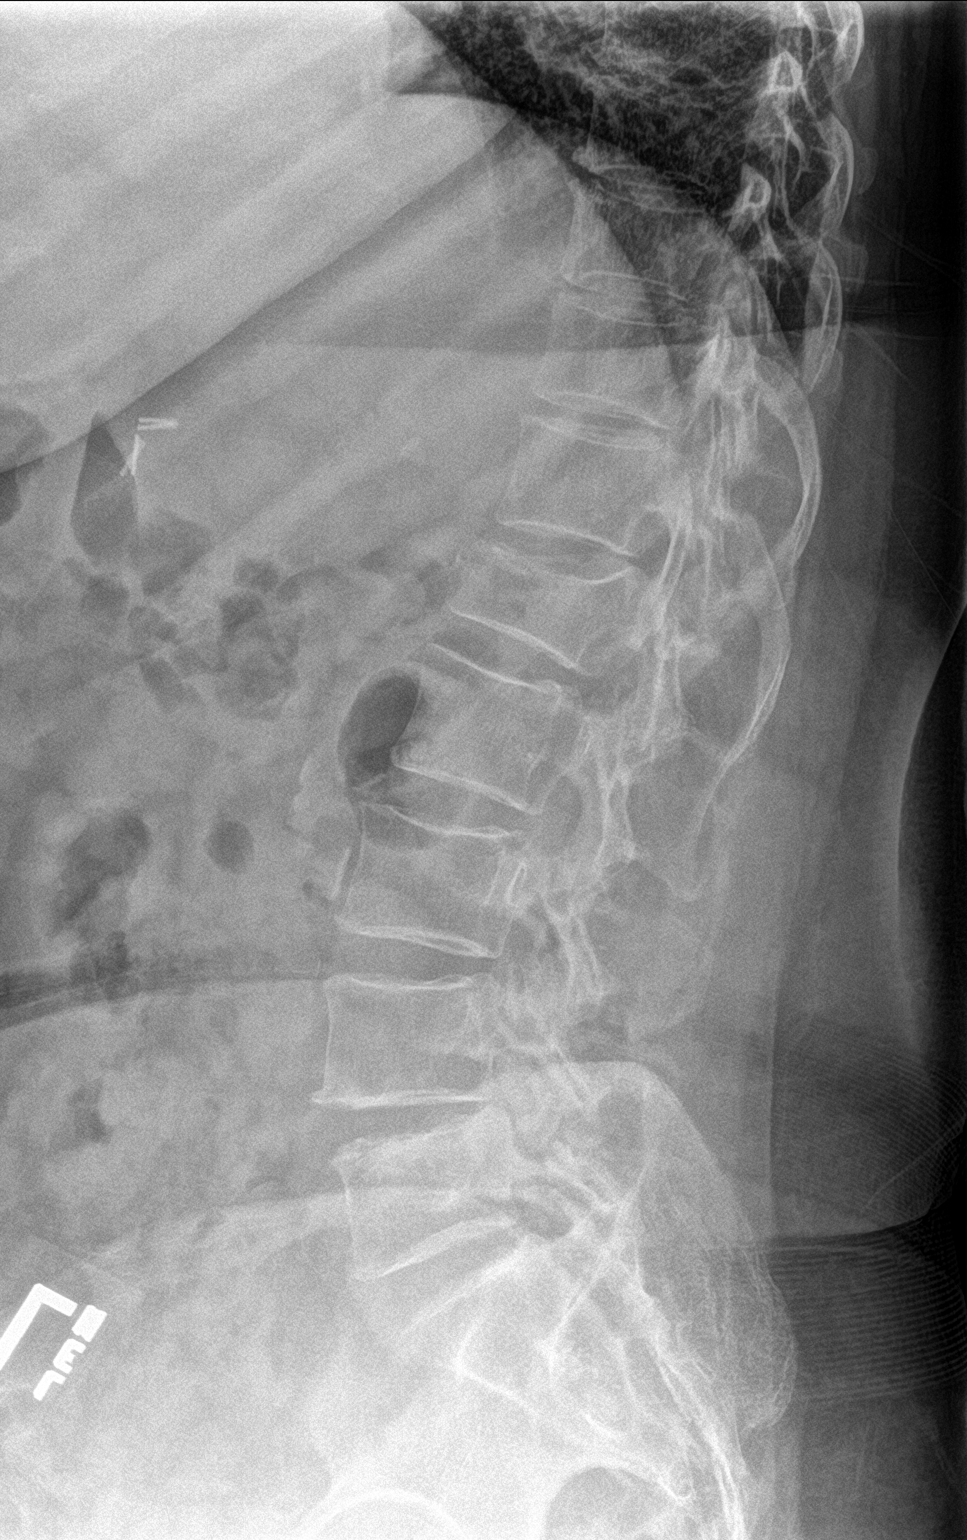
[im 3/3]
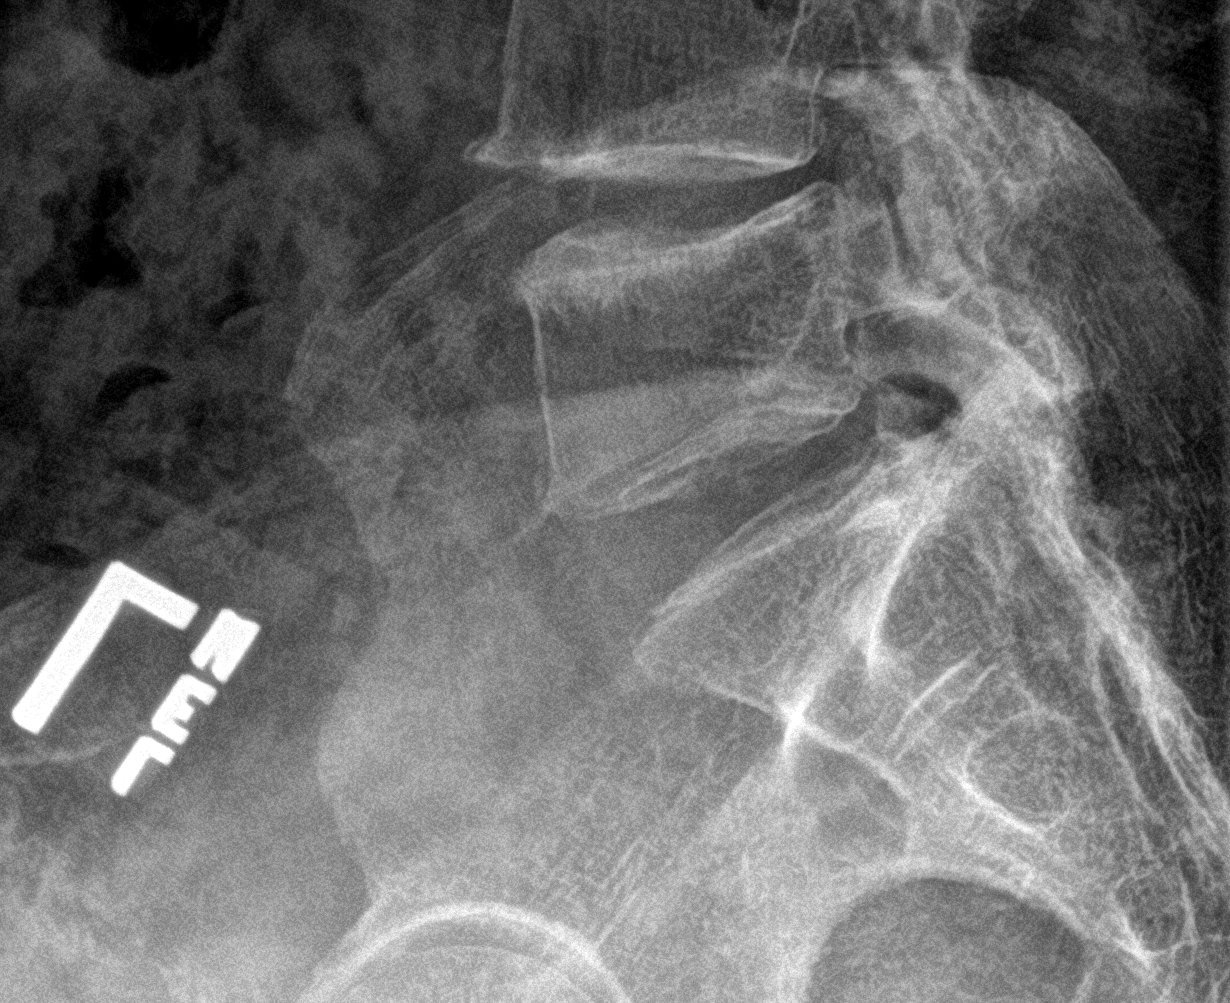

[3 of 3 positions shown; findings below may reference images not displayed]

FINDINGS: There is a compression fracture of the L1 vertebra with
approximately 50% loss of vertebral body height and anterior
wedging. This is age indeterminate but an acute fracture is not
excluded. Correlation with clinical exam and point tenderness
recommended. CT or MRI may provide better evaluation if clinically
indicated. There appears to be a degree of buckling of the posterior
cortex of the T1. If there is focal neurological deficit or clinical
findings of cord compression further evaluation with MRI is
recommended. No other fracture identified. The bones are osteopenic.
Multilevel degenerative changes and facet arthropathy. The soft
tissues are grossly unremarkable.
IMPRESSION: Age indeterminate compression fracture of the T1 vertebra, possibly
acute. Correlation with clinical exam recommended. CT or MRI may
provide better evaluation.

## 2020-11-17 ENCOUNTER — Other Ambulatory Visit: Payer: Self-pay | Admitting: Family Medicine

## 2020-11-17 DIAGNOSIS — Z1231 Encounter for screening mammogram for malignant neoplasm of breast: Secondary | ICD-10-CM

## 2021-01-16 ENCOUNTER — Other Ambulatory Visit: Payer: Self-pay

## 2021-01-16 ENCOUNTER — Ambulatory Visit
Admission: RE | Admit: 2021-01-16 | Discharge: 2021-01-16 | Disposition: A | Discharge status: Home / Self Care | Payer: Medicare Other | Source: Ambulatory Visit | Attending: Family Medicine | Admitting: Family Medicine

## 2021-01-16 DIAGNOSIS — Z1231 Encounter for screening mammogram for malignant neoplasm of breast: Secondary | ICD-10-CM | POA: Diagnosis not present

## 2021-02-15 ENCOUNTER — Other Ambulatory Visit: Payer: Self-pay | Admitting: Neurology

## 2021-02-15 DIAGNOSIS — M7121 Synovial cyst of popliteal space [Baker], right knee: Secondary | ICD-10-CM

## 2021-03-01 ENCOUNTER — Other Ambulatory Visit: Payer: Self-pay

## 2021-03-01 ENCOUNTER — Ambulatory Visit
Admission: RE | Admit: 2021-03-01 | Discharge: 2021-03-01 | Disposition: A | Discharge status: Home / Self Care | Payer: Medicare Other | Source: Ambulatory Visit | Attending: Neurology | Admitting: Neurology

## 2021-03-01 DIAGNOSIS — M7121 Synovial cyst of popliteal space [Baker], right knee: Secondary | ICD-10-CM | POA: Insufficient documentation

## 2021-04-10 ENCOUNTER — Encounter: Payer: Self-pay | Admitting: Ophthalmology

## 2021-04-14 NOTE — Discharge Instructions (Signed)

## 2021-04-17 ENCOUNTER — Encounter
Admission: RE | Disposition: A | Discharge status: Home / Self Care | Payer: Self-pay | Source: Home / Self Care | Attending: Ophthalmology

## 2021-04-17 ENCOUNTER — Ambulatory Visit: Payer: Medicare Other | Admitting: Anesthesiology

## 2021-04-17 ENCOUNTER — Encounter: Payer: Self-pay | Admitting: Ophthalmology

## 2021-04-17 ENCOUNTER — Other Ambulatory Visit: Payer: Self-pay

## 2021-04-17 ENCOUNTER — Ambulatory Visit
Admission: RE | Admit: 2021-04-17 | Discharge: 2021-04-17 | Disposition: A | Discharge status: Home / Self Care | Payer: Medicare Other | Attending: Ophthalmology | Admitting: Ophthalmology

## 2021-04-17 DIAGNOSIS — G4730 Sleep apnea, unspecified: Secondary | ICD-10-CM | POA: Insufficient documentation

## 2021-04-17 DIAGNOSIS — I10 Essential (primary) hypertension: Secondary | ICD-10-CM | POA: Diagnosis not present

## 2021-04-17 DIAGNOSIS — H2511 Age-related nuclear cataract, right eye: Secondary | ICD-10-CM | POA: Diagnosis not present

## 2021-04-17 DIAGNOSIS — E039 Hypothyroidism, unspecified: Secondary | ICD-10-CM | POA: Insufficient documentation

## 2021-04-17 DIAGNOSIS — E1136 Type 2 diabetes mellitus with diabetic cataract: Secondary | ICD-10-CM | POA: Insufficient documentation

## 2021-04-17 HISTORY — PX: CATARACT EXTRACTION W/PHACO: SHX586

## 2021-04-17 HISTORY — DX: Dizziness and giddiness: R42

## 2021-04-17 HISTORY — DX: Synovial cyst of popliteal space (Baker), right knee: M71.21

## 2021-04-17 LAB — GLUCOSE, CAPILLARY
Glucose-Capillary: 137 mg/dL — ABNORMAL HIGH (ref 70–99)
Glucose-Capillary: 151 mg/dL — ABNORMAL HIGH (ref 70–99)

## 2021-04-17 SURGERY — PHACOEMULSIFICATION, CATARACT, WITH IOL INSERTION
Anesthesia: Monitor Anesthesia Care | Site: Eye | Laterality: Right

## 2021-04-17 MED ORDER — SIGHTPATH DOSE#1 BSS IO SOLN
INTRAOCULAR | Status: DC | PRN
  Administered 2021-04-17: 82 mL via OPHTHALMIC

## 2021-04-17 MED ORDER — TETRACAINE HCL 0.5 % OP SOLN
1.0000 [drp] | OPHTHALMIC | Status: DC | PRN
  Administered 2021-04-17 (×3): 1 [drp] via OPHTHALMIC

## 2021-04-17 MED ORDER — SIGHTPATH DOSE#1 SODIUM HYALURONATE 10 MG/ML IO SOLUTION
PREFILLED_SYRINGE | INTRAOCULAR | Status: DC | PRN
  Administered 2021-04-17: 0.85 mL via INTRAOCULAR

## 2021-04-17 MED ORDER — FENTANYL CITRATE (PF) 100 MCG/2ML IJ SOLN
INTRAMUSCULAR | Status: DC | PRN
  Administered 2021-04-17: 50 ug via INTRAVENOUS

## 2021-04-17 MED ORDER — ONDANSETRON HCL 4 MG/2ML IJ SOLN: 4.0000 mg | Freq: Once | INTRAMUSCULAR | Status: DC | PRN

## 2021-04-17 MED ORDER — SIGHTPATH DOSE#1 BSS IO SOLN
INTRAOCULAR | Status: DC | PRN
  Administered 2021-04-17: 15 mL

## 2021-04-17 MED ORDER — LIDOCAINE HCL (PF) 2 % IJ SOLN
INTRAMUSCULAR | Status: DC | PRN
  Administered 2021-04-17: 1 mL via INTRAOCULAR

## 2021-04-17 MED ORDER — SIGHTPATH DOSE#1 SODIUM HYALURONATE 23 MG/ML IO SOLUTION
PREFILLED_SYRINGE | INTRAOCULAR | Status: DC | PRN
Start: 2021-04-17 — End: 2021-04-17
  Administered 2021-04-17: 0.6 mL via INTRAOCULAR

## 2021-04-17 MED ORDER — ACETAMINOPHEN 160 MG/5ML PO SOLN: 975.0000 mg | Freq: Once | ORAL | Status: DC | PRN

## 2021-04-17 MED ORDER — MOXIFLOXACIN HCL 0.5 % OP SOLN
OPHTHALMIC | Status: DC | PRN
Start: 2021-04-17 — End: 2021-04-17
  Administered 2021-04-17: 0.2 mL via OPHTHALMIC

## 2021-04-17 MED ORDER — MIDAZOLAM HCL 2 MG/2ML IJ SOLN
INTRAMUSCULAR | Status: DC | PRN
  Administered 2021-04-17: 1 mg via INTRAVENOUS
  Administered 2021-04-17: .5 mg via INTRAVENOUS

## 2021-04-17 MED ORDER — ACETAMINOPHEN 500 MG PO TABS: 1000.0000 mg | ORAL_TABLET | Freq: Once | ORAL | Status: DC | PRN

## 2021-04-17 MED ORDER — ARMC OPHTHALMIC DILATING DROPS
1.0000 "application " | OPHTHALMIC | Status: DC | PRN
  Administered 2021-04-17 (×3): 1 via OPHTHALMIC

## 2021-04-17 MED ORDER — LACTATED RINGERS IV SOLN: INTRAVENOUS | Status: DC

## 2021-04-17 SURGICAL SUPPLY — 14 items
CATARACT SUITE SIGHTPATH (MISCELLANEOUS) ×2 IMPLANT
DISSECTOR HYDRO NUCLEUS 50X22 (MISCELLANEOUS) ×2 IMPLANT
FEE CATARACT SUITE SIGHTPATH (MISCELLANEOUS) ×1 IMPLANT
GLOVE SURG GAMMEX PI TX LF 7.5 (GLOVE) ×2 IMPLANT
GLOVE SURG SYN 8.5  E (GLOVE) ×1
GLOVE SURG SYN 8.5 E (GLOVE) ×1 IMPLANT
GLOVE SURG SYN 8.5 PF PI (GLOVE) ×1 IMPLANT
LENS IOL TECNIS EYHANCE 24.5 (Intraocular Lens) ×1 IMPLANT
NDL FILTER BLUNT 18X1 1/2 (NEEDLE) ×1 IMPLANT
NEEDLE FILTER BLUNT 18X 1/2SAF (NEEDLE) ×1
NEEDLE FILTER BLUNT 18X1 1/2 (NEEDLE) ×1 IMPLANT
SYR 3ML LL SCALE MARK (SYRINGE) ×2 IMPLANT
SYR 5ML LL (SYRINGE) ×2 IMPLANT
WATER STERILE IRR 250ML POUR (IV SOLUTION) ×2 IMPLANT

## 2021-04-17 NOTE — Anesthesia Postprocedure Evaluation (Signed)
Anesthesia Post Note ? ?Patient: Kelly Wells ? ?Procedure(s) Performed: CATARACT EXTRACTION PHACO AND INTRAOCULAR LENS PLACEMENT (IOC) RIGHT DIABETIC 4.90 00:40.5 (Right: Eye) ? ? ?  ?Patient location during evaluation: PACU ?Anesthesia Type: MAC ?Level of consciousness: awake and alert ?Pain management: pain level controlled ?Vital Signs Assessment: post-procedure vital signs reviewed and stable ?Respiratory status: spontaneous breathing, nonlabored ventilation, respiratory function stable and patient connected to nasal cannula oxygen ?Cardiovascular status: stable and blood pressure returned to baseline ?Postop Assessment: no apparent nausea or vomiting ?Anesthetic complications: no ? ? ?No notable events documented. ? ?Trecia Rogers ? ? ? ? ? ?

## 2021-04-17 NOTE — H&P (Signed)
Kelly Wells  ? ?Primary Care Physician:  [facility] ?Ophthalmologist: Dr. Benay Pillow ? ?Pre-Procedure History & Physical: ?HPI:  Kelly Wells is a 76 y.o. female here for cataract surgery. ?  ?Past Medical History:  ?Diagnosis Date  ? Anemia   ? Anxiety   ? Arthritis   ? KNEES,HANDS  ? Baker's cyst of knee, right   ? Depression   ? Diabetes mellitus without complication ([place])   ? type 2  ? Headache   ? SINUS ISSUES  ? Hyperlipidemia   ? Hypertension   ? Hypothyroidism   ? Sinusitis   ? chronic, with nasal obstruction,tinnitis, and hearing loss  ? Sleep apnea   ? NO CPAP  ? Vertigo   ? 4 day episode end of March 2023  ? ? ?Past Surgical History:  ?Procedure Laterality Date  ? ABDOMINAL HYSTERECTOMY    ? BREAST BIOPSY Bilateral 1980's  ? sugical bx   ? CARDIAC CATHETERIZATION    ? YEARS AGO  ? CHOLECYSTECTOMY    ? COLONOSCOPY WITH PROPOFOL N/A 07/04/2015  ? Procedure: COLONOSCOPY WITH PROPOFOL;  Surgeon: Manya Silvas, MD;  Location: [facility] ENDOSCOPY;  Service: Endoscopy;  Laterality: N/A;  ? ETHMOIDECTOMY Left 03/13/2017  ? Procedure: ANTERIOR ETHMOIDECTOMY WITH FRONTAL SINUSOTOMY;  Surgeon: Carloyn Manner, MD;  Location: [place];  Service: ENT;  Laterality: Left;  ? IMAGE GUIDED SINUS SURGERY Left 03/13/2017  ? Procedure: IMAGE GUIDED SINUS SURGERY;  Surgeon: Carloyn Manner, MD;  Location: [place];  Service: ENT;  Laterality: Left;  NEED STRYKER DISK ?DIABETIC/ SLEEP APNEA-no CPAP ?GAVE DISK TO CECE 2-28  ? JOINT REPLACEMENT Left 2005  ? knee  ? MAXILLARY ANTROSTOMY Left 03/13/2017  ? Procedure: MAXILLARY ANTROSTOMY WITH TISSUE REMOVAL;  Surgeon: Carloyn Manner, MD;  Location: [place];  Service: ENT;  Laterality: Left;  ? ? ?Prior to Admission medications   ?Medication Sig Start Date End Date Taking? Authorizing Provider  ?clonazePAM (KLONOPIN) 0.5 MG tablet Take 0.5 mg by mouth 2 (two) times daily as needed for anxiety.   Yes [provider]   ?empagliflozin (JARDIANCE) 10 MG TABS tablet Take 10 mg by mouth daily.   Yes [provider]  ?FLUoxetine (PROZAC) 40 MG capsule Take 40 mg by mouth daily. AM   Yes [provider]  ?gabapentin (NEURONTIN) 600 MG tablet Take 600 mg by mouth 3 (three) times daily. 600 mg AM, 600 mg lunchtime, 1200 mg PM   Yes [provider]  ?levothyroxine (SYNTHROID) 100 MCG tablet Take 100 mcg by mouth daily before breakfast.   Yes [provider]  ?lisinopril (ZESTRIL) 10 MG tablet Take 10 mg by mouth daily.   Yes [provider]  ?loperamide (IMODIUM A-D) 2 MG tablet Take 2 mg by mouth 4 (four) times daily as needed for diarrhea or loose stools.   Yes [provider]  ?meclizine (ANTIVERT) 25 MG tablet Take 25 mg by mouth 3 (three) times daily as needed for dizziness.   Yes [provider]  ?metFORMIN (GLUCOPHAGE) 1000 MG tablet Take 1,000 mg by mouth 2 (two) times daily with a meal.   Yes [provider]  ?metoprolol succinate (TOPROL-XL) 25 MG 24 hr tablet Take 25 mg by mouth daily.   Yes [provider]  ?pregabalin (LYRICA) 50 MG capsule Take 50 mg by mouth 4 (four) times daily as needed.   Yes [provider]  ?rOPINIRole (REQUIP) 0.5 MG tablet Take 0.5  mg by mouth at bedtime.   Yes [provider]  ?traMADol (ULTRAM) 50 MG tablet Take 50-100 mg by mouth every 6 (six) hours as needed.   Yes [provider]  ? ? ?Allergies as of 03/28/2021 - Review Complete 08/18/2017  ?Allergen Reaction Noted  ? Keflex [cephalexin] Itching 07/01/2015  ? Clindamycin/lincomycin Rash 03/06/2017  ? ? ?Family History  ?Problem Relation Age of Onset  ? Stroke Mother   ? Breast cancer Neg Hx   ? ? ?Social History  ? ?Socioeconomic History  ? Marital status: Widowed  ?  Spouse name: Not on file  ? Number of children: Not on file  ? Years of education: Not on file  ? Highest education level: Not on file  ?Occupational History  ? Not on file   ?Tobacco Use  ? Smoking status: Never  ? Smokeless tobacco: Never  ?Vaping Use  ? Vaping Use: Never used  ?Substance and Sexual Activity  ? Alcohol use: No  ? Drug use: No  ? Sexual activity: Not on file  ?Other Topics Concern  ? Not on file  ?Social History Narrative  ? Not on file  ? ?Social Determinants of Health  ? ?Financial Resource Strain: Not on file  ?Food Insecurity: Not on file  ?Transportation Needs: Not on file  ?Physical Activity: Not on file  ?Stress: Not on file  ?Social Connections: Not on file  ?Intimate Partner Violence: Not on file  ? ? ?Review of Systems: ?See HPI, otherwise negative ROS ? ?Physical Exam: ?BP (!) 146/88   Pulse 83   Temp (!) 97 ?F (36.1 ?C) (Temporal)   Ht '5\' 3"'$  (1.6 m)   Wt 79.4 kg   SpO2 97%   BMI 31.00 kg/m?  ?General:   Alert, cooperative in NAD ?Head:  Normocephalic and atraumatic. ?Respiratory:  Normal work of breathing. ?Cardiovascular:  RRR ? ?Impression/Plan: ?Kelly Wells is here for cataract surgery. ? ?Risks, benefits, limitations, and alternatives regarding cataract surgery have been reviewed with the patient.  Questions have been answered.  All parties agreeable. ? ? ?Benay Pillow, MD  04/17/2021, 7:28 AM ? ? ?

## 2021-04-17 NOTE — Anesthesia Procedure Notes (Signed)
Procedure Name: [place] ?Date/Time: 04/17/2021 7:39 AM ?Performed by: Mayme Genta, CRNA ?Pre-anesthesia Checklist: Patient identified, Emergency Drugs available, Suction available, Timeout performed and Patient being monitored ?Patient Re-evaluated:Patient Re-evaluated prior to induction ?Oxygen Delivery Method: Nasal cannula ?Placement Confirmation: positive ETCO2 ? ? ? ? ?

## 2021-04-17 NOTE — Op Note (Signed)
OPERATIVE NOTE ? ?REENE HARLACHER ?[id number] ?04/17/2021 ? ? ?PREOPERATIVE DIAGNOSIS:  Nuclear sclerotic cataract right eye.  H25.11 ?  ?POSTOPERATIVE DIAGNOSIS:    Nuclear sclerotic cataract right eye.   ?  ?PROCEDURE:  Phacoemusification with posterior chamber intraocular lens placement of the right eye  ? ?LENS:   ?Implant Name Type Inv. Item Serial No. Manufacturer Lot No. LRB No. Used Action  ?LENS IOL TECNIS EYHANCE 24.5 - [id number] Intraocular Lens LENS IOL TECNIS EYHANCE 24.5 [id number] [employer]  Right 1 Implanted  ?    ? ?Procedure(s) with comments: ?CATARACT EXTRACTION PHACO AND INTRAOCULAR LENS PLACEMENT (IOC) RIGHT DIABETIC 4.90 00:40.5 (Right) - Diabetic ? ?DIB00 +24.5 ?  ?ULTRASOUND TIME: 0 minutes 40 seconds.  CDE 4.90 ?  ?SURGEON:  Benay Pillow, MD, MPH ? ?ANESTHESIOLOGIST: Anesthesiologist: Rochel Brome, MD ?CRNA: Mayme Genta, CRNA ?  ?ANESTHESIA:  Topical with tetracaine drops augmented with 1% preservative-free intracameral lidocaine. ? ?ESTIMATED BLOOD LOSS: less than 1 mL. ?  ?COMPLICATIONS:  None. ?  ?DESCRIPTION OF PROCEDURE:  The patient was identified in the holding room and transported to the operating room and placed in the supine position under the operating microscope.  The right eye was identified as the operative eye and it was prepped and draped in the usual sterile ophthalmic fashion. ?  ?A 1.0 millimeter clear-corneal paracentesis was made at the 10:30 position. 0.5 ml of preservative-free 1% lidocaine with epinephrine was injected into the anterior chamber. ? The anterior chamber was filled with Healon 5 viscoelastic.  A 2.4 millimeter keratome was used to make a near-clear corneal incision at the 8:00 position.  A curvilinear capsulorrhexis was made with a cystotome and capsulorrhexis forceps.  Balanced salt solution was used to hydrodissect and hydrodelineate the nucleus. ?  ?Phacoemulsification was then used in stop and chop fashion to remove the lens nucleus and epinucleus.   The remaining cortex was then removed using the irrigation and aspiration handpiece. Healon was then placed into the capsular bag to distend it for lens placement.  A lens was then injected into the capsular bag.  The remaining viscoelastic was aspirated. ?  ?Wounds were hydrated with balanced salt solution.  The anterior chamber was inflated to a physiologic pressure with balanced salt solution.  ? ?Intracameral vigamox 0.1 mL undiluted was injected into the eye and a drop placed onto the ocular surface. ? ?No wound leaks were noted.  The patient was taken to the recovery room in stable condition without complications of anesthesia or surgery ? ?Benay Pillow ?04/17/2021, 7:59 AM ? ?

## 2021-04-17 NOTE — Anesthesia Preprocedure Evaluation (Signed)
Anesthesia Evaluation  ?Patient identified by MRN, date of birth, ID band ?Patient awake ? ? ? ?Reviewed: ?Allergy & Precautions, H&P , NPO status , Patient's Chart, lab work & pertinent test results, reviewed documented beta blocker date and time  ? ?Airway ?Mallampati: II ? ?TM Distance: >3 FB ?Neck ROM: full ? ? ? Dental ?no notable dental hx. ? ?  ?Pulmonary ?sleep apnea ,  ?  ?Pulmonary exam normal ?breath sounds clear to auscultation ? ? ? ? ? ? Cardiovascular ?Exercise Tolerance: Good ?hypertension, Normal cardiovascular exam ?Rhythm:regular Rate:Normal ? ? ?  ?Neuro/Psych ?negative neurological ROS ? negative psych ROS  ? GI/Hepatic ?negative GI ROS, Neg liver ROS,   ?Endo/Other  ?diabetes, Type 2Hypothyroidism  ? Renal/GU ?negative Renal ROS  ?negative genitourinary ?  ?Musculoskeletal ? ? Abdominal ?  ?Peds ? Hematology ?negative hematology ROS ?(+)   ?Anesthesia Other Findings ? ? Reproductive/Obstetrics ?negative OB ROS ? ?  ? ? ? ? ? ? ? ? ? ? ? ? ? ?  ?  ? ? ? ? ? ? ? ? ?Anesthesia Physical ?Anesthesia Plan ? ?ASA: 2 ? ?Anesthesia Plan: MAC  ? ?Post-op Pain Management:   ? ?Induction:  ? ?PONV Risk Score and Plan:  ? ?Airway Management Planned:  ? ?Additional Equipment:  ? ?Intra-op Plan:  ? ?Post-operative Plan:  ? ?Informed Consent: I have reviewed the patients History and Physical, chart, labs and discussed the procedure including the risks, benefits and alternatives for the proposed anesthesia with the patient or authorized representative who has indicated his/her understanding and acceptance.  ? ? ? ?Dental Advisory Given ? ?Plan Discussed with: CRNA and Anesthesiologist ? ?Anesthesia Plan Comments:   ? ? ? ? ? ? ?Anesthesia Quick Evaluation ? ?

## 2021-04-17 NOTE — Transfer of Care (Signed)
Immediate Anesthesia Transfer of Care Note ? ?Patient: Kelly Wells ? ?Procedure(s) Performed: CATARACT EXTRACTION PHACO AND INTRAOCULAR LENS PLACEMENT (IOC) RIGHT DIABETIC 4.90 00:40.5 (Right: Eye) ? ?Patient Location: PACU ? ?Anesthesia Type: MAC ? ?Level of Consciousness: awake, alert  and patient cooperative ? ?Airway and Oxygen Therapy: Patient Spontanous Breathing and Patient connected to supplemental oxygen ? ?Post-op Assessment: Post-op Vital signs reviewed, Patient's Cardiovascular Status Stable, Respiratory Function Stable, Patent Airway and No signs of Nausea or vomiting ? ?Post-op Vital Signs: Reviewed and stable ? ?Complications: No notable events documented. ? ?

## 2021-04-18 ENCOUNTER — Encounter: Payer: Self-pay | Admitting: Ophthalmology

## 2021-05-04 NOTE — Discharge Instructions (Signed)

## 2021-05-08 ENCOUNTER — Encounter: Payer: Self-pay | Admitting: Ophthalmology

## 2021-05-08 ENCOUNTER — Other Ambulatory Visit: Payer: Self-pay

## 2021-05-08 ENCOUNTER — Ambulatory Visit: Payer: Medicare Other | Admitting: Anesthesiology

## 2021-05-08 ENCOUNTER — Encounter
Admission: RE | Disposition: A | Discharge status: Home / Self Care | Payer: Self-pay | Source: Home / Self Care | Attending: Ophthalmology

## 2021-05-08 ENCOUNTER — Ambulatory Visit
Admission: RE | Admit: 2021-05-08 | Discharge: 2021-05-08 | Disposition: A | Discharge status: Home / Self Care | Payer: Medicare Other | Attending: Ophthalmology | Admitting: Ophthalmology

## 2021-05-08 DIAGNOSIS — I10 Essential (primary) hypertension: Secondary | ICD-10-CM | POA: Insufficient documentation

## 2021-05-08 DIAGNOSIS — F419 Anxiety disorder, unspecified: Secondary | ICD-10-CM | POA: Insufficient documentation

## 2021-05-08 DIAGNOSIS — Z6831 Body mass index (BMI) 31.0-31.9, adult: Secondary | ICD-10-CM | POA: Insufficient documentation

## 2021-05-08 DIAGNOSIS — E1136 Type 2 diabetes mellitus with diabetic cataract: Secondary | ICD-10-CM | POA: Insufficient documentation

## 2021-05-08 DIAGNOSIS — E039 Hypothyroidism, unspecified: Secondary | ICD-10-CM | POA: Insufficient documentation

## 2021-05-08 DIAGNOSIS — Z79890 Hormone replacement therapy (postmenopausal): Secondary | ICD-10-CM | POA: Diagnosis not present

## 2021-05-08 DIAGNOSIS — Z7984 Long term (current) use of oral hypoglycemic drugs: Secondary | ICD-10-CM | POA: Diagnosis not present

## 2021-05-08 DIAGNOSIS — E669 Obesity, unspecified: Secondary | ICD-10-CM | POA: Insufficient documentation

## 2021-05-08 DIAGNOSIS — G4730 Sleep apnea, unspecified: Secondary | ICD-10-CM | POA: Diagnosis not present

## 2021-05-08 DIAGNOSIS — E785 Hyperlipidemia, unspecified: Secondary | ICD-10-CM | POA: Diagnosis not present

## 2021-05-08 DIAGNOSIS — H2512 Age-related nuclear cataract, left eye: Secondary | ICD-10-CM | POA: Diagnosis present

## 2021-05-08 HISTORY — PX: CATARACT EXTRACTION W/PHACO: SHX586

## 2021-05-08 LAB — GLUCOSE, CAPILLARY
Glucose-Capillary: 151 mg/dL — ABNORMAL HIGH (ref 70–99)
Glucose-Capillary: 162 mg/dL — ABNORMAL HIGH (ref 70–99)

## 2021-05-08 SURGERY — PHACOEMULSIFICATION, CATARACT, WITH IOL INSERTION
Anesthesia: Monitor Anesthesia Care | Site: Eye | Laterality: Left

## 2021-05-08 MED ORDER — SIGHTPATH DOSE#1 SODIUM HYALURONATE 23 MG/ML IO SOLUTION
PREFILLED_SYRINGE | INTRAOCULAR | Status: DC | PRN
  Administered 2021-05-08: 0.6 mL via INTRAOCULAR

## 2021-05-08 MED ORDER — LACTATED RINGERS IV SOLN: INTRAVENOUS | Status: DC

## 2021-05-08 MED ORDER — EPINEPHRINE PF 1 MG/ML IJ SOLN
INTRAMUSCULAR | Status: DC | PRN
  Administered 2021-05-08: 64 mL via OPHTHALMIC

## 2021-05-08 MED ORDER — MIDAZOLAM HCL 2 MG/2ML IJ SOLN
INTRAMUSCULAR | Status: DC | PRN
Start: 2021-05-08 — End: 2021-05-08
  Administered 2021-05-08: 2 mg via INTRAVENOUS

## 2021-05-08 MED ORDER — FENTANYL CITRATE (PF) 100 MCG/2ML IJ SOLN
INTRAMUSCULAR | Status: DC | PRN
  Administered 2021-05-08: 50 ug via INTRAVENOUS

## 2021-05-08 MED ORDER — TETRACAINE HCL 0.5 % OP SOLN
1.0000 [drp] | OPHTHALMIC | Status: DC | PRN
  Administered 2021-05-08 (×3): 1 [drp] via OPHTHALMIC

## 2021-05-08 MED ORDER — ARMC OPHTHALMIC DILATING DROPS
1.0000 "application " | OPHTHALMIC | Status: DC | PRN
  Administered 2021-05-08 (×3): 1 via OPHTHALMIC

## 2021-05-08 MED ORDER — SIGHTPATH DOSE#1 BSS IO SOLN
INTRAOCULAR | Status: DC | PRN
Start: 2021-05-08 — End: 2021-05-08
  Administered 2021-05-08: 15 mL via INTRAOCULAR

## 2021-05-08 MED ORDER — SIGHTPATH DOSE#1 SODIUM HYALURONATE 10 MG/ML IO SOLUTION
PREFILLED_SYRINGE | INTRAOCULAR | Status: DC | PRN
Start: 2021-05-08 — End: 2021-05-08
  Administered 2021-05-08: 0.85 mL via INTRAOCULAR

## 2021-05-08 MED ORDER — ONDANSETRON HCL 4 MG/2ML IJ SOLN
INTRAMUSCULAR | Status: DC | PRN
Start: 2021-05-08 — End: 2021-05-08
  Administered 2021-05-08: 4 mg via INTRAVENOUS

## 2021-05-08 MED ORDER — MOXIFLOXACIN HCL 0.5 % OP SOLN
OPHTHALMIC | Status: DC | PRN
  Administered 2021-05-08: 0.2 mL via OPHTHALMIC

## 2021-05-08 MED ORDER — LIDOCAINE HCL (PF) 2 % IJ SOLN
INTRAOCULAR | Status: DC | PRN
  Administered 2021-05-08: 4 mL via INTRAOCULAR

## 2021-05-08 SURGICAL SUPPLY — 14 items
CATARACT SUITE SIGHTPATH (MISCELLANEOUS) ×2 IMPLANT
DISSECTOR HYDRO NUCLEUS 50X22 (MISCELLANEOUS) ×2 IMPLANT
FEE CATARACT SUITE SIGHTPATH (MISCELLANEOUS) ×1 IMPLANT
GLOVE SURG GAMMEX PI TX LF 7.5 (GLOVE) ×2 IMPLANT
GLOVE SURG SYN 8.5  E (GLOVE) ×1
GLOVE SURG SYN 8.5 E (GLOVE) ×1 IMPLANT
GLOVE SURG SYN 8.5 PF PI (GLOVE) ×1 IMPLANT
LENS IOL TECNIS EYHANCE 24.5 (Intraocular Lens) ×1 IMPLANT
NDL FILTER BLUNT 18X1 1/2 (NEEDLE) ×1 IMPLANT
NEEDLE FILTER BLUNT 18X 1/2SAF (NEEDLE) ×1
NEEDLE FILTER BLUNT 18X1 1/2 (NEEDLE) ×1 IMPLANT
SYR 3ML LL SCALE MARK (SYRINGE) ×2 IMPLANT
SYR 5ML LL (SYRINGE) ×2 IMPLANT
WATER STERILE IRR 250ML POUR (IV SOLUTION) ×2 IMPLANT

## 2021-05-08 NOTE — Anesthesia Preprocedure Evaluation (Signed)
Anesthesia Evaluation  ?Patient identified by MRN, date of birth, ID band ?Patient awake ? ? ? ?Reviewed: ?Allergy & Precautions, NPO status , Patient's Chart, lab work & pertinent test results, reviewed documented beta blocker date and time  ? ?History of Anesthesia Complications ?Negative for: history of anesthetic complications ? ?Airway ?Mallampati: III ? ?TM Distance: >3 FB ?Neck ROM: Limited ? ? ? Dental ?  ?Pulmonary ?sleep apnea ,  ?  ?breath sounds clear to auscultation ? ? ? ? ? ? Cardiovascular ?hypertension, (-) angina(-) DOE  ?Rhythm:Regular Rate:Normal ? ? ?HLD ?  ?Neuro/Psych ? Headaches, PSYCHIATRIC DISORDERS Anxiety Depression   ? GI/Hepatic ?neg GERD  ,  ?Endo/Other  ?diabetes, Type 2Hypothyroidism  ? Renal/GU ?  ? ?  ?Musculoskeletal ? ?(+) Arthritis ,  ? Abdominal ?(+) + obese (BMI 31),   ?Peds ? Hematology ? ?(+) Blood dyscrasia, anemia ,   ?Anesthesia Other Findings ? ? Reproductive/Obstetrics ? ?  ? ? ? ? ? ? ? ? ? ? ? ? ? ?  ?  ? ? ? ? ? ? ? ?Anesthesia Physical ?Anesthesia Plan ? ?ASA: 2 ? ?Anesthesia Plan: MAC  ? ?Post-op Pain Management:   ? ?Induction: Intravenous ? ?PONV Risk Score and Plan: 2 and TIVA, Midazolam and Treatment may vary due to age or medical condition ? ?Airway Management Planned: Nasal Cannula ? ?Additional Equipment:  ? ?Intra-op Plan:  ? ?Post-operative Plan:  ? ?Informed Consent: I have reviewed the patients History and Physical, chart, labs and discussed the procedure including the risks, benefits and alternatives for the proposed anesthesia with the patient or authorized representative who has indicated his/her understanding and acceptance.  ? ? ? ? ? ?Plan Discussed with: CRNA and Anesthesiologist ? ?Anesthesia Plan Comments:   ? ? ? ? ? ? ?Anesthesia Quick Evaluation ? ?

## 2021-05-08 NOTE — H&P (Signed)
[facility]  ? ?Primary Care Physician:  [facility] ?Ophthalmologist: Dr. Benay Pillow ? ?Pre-Procedure History & Physical: ?HPI:  Kelly Wells is a 76 y.o. female here for cataract surgery. ?  ?Past Medical History:  ?Diagnosis Date  ? Anemia   ? Anxiety   ? Arthritis   ? KNEES,HANDS  ? Baker's cyst of knee, right   ? Depression   ? Diabetes mellitus without complication ([place])   ? type 2  ? Headache   ? SINUS ISSUES  ? Hyperlipidemia   ? Hypertension   ? Hypothyroidism   ? Sinusitis   ? chronic, with nasal obstruction,tinnitis, and hearing loss  ? Sleep apnea   ? NO CPAP  ? Vertigo   ? 4 day episode end of March 2023  ? ? ?Past Surgical History:  ?Procedure Laterality Date  ? ABDOMINAL HYSTERECTOMY    ? BREAST BIOPSY Bilateral 1980's  ? sugical bx   ? CARDIAC CATHETERIZATION    ? YEARS AGO  ? CATARACT EXTRACTION W/PHACO Right 04/17/2021  ? Procedure: CATARACT EXTRACTION PHACO AND INTRAOCULAR LENS PLACEMENT (IOC) RIGHT DIABETIC 4.90 00:40.5;  Surgeon: Eulogio Bear, MD;  Location: [facility];  Service: Ophthalmology;  Laterality: Right;  Diabetic  ? CHOLECYSTECTOMY    ? COLONOSCOPY WITH PROPOFOL N/A 07/04/2015  ? Procedure: COLONOSCOPY WITH PROPOFOL;  Surgeon: Manya Silvas, MD;  Location[facility] ENDOSCOPY;  Service: Endoscopy;  Laterality: N/A;  ? ETHMOIDECTOMY Left 03/13/2017  ? Procedure: ANTERIOR ETHMOIDECTOMY WITH FRONTAL SINUSOTOMY;  Surgeon: Carloyn Manner, MD;  Location: [place];  Service: ENT;  Laterality: Left;  ? IMAGE GUIDED SINUS SURGERY Left 03/13/2017  ? Procedure: IMAGE GUIDED SINUS SURGERY;  Surgeon: Carloyn Manner, MD;  Location: [place];  Service: ENT;  Laterality: Left;  NEED STRYKER DISK ?DIABETIC/ SLEEP APNEA-no CPAP ?GAVE DISK TO [facility] 2-28  ? JOINT REPLACEMENT Left 2005  ? knee  ? MAXILLARY ANTROSTOMY Left 03/13/2017  ? Procedure: MAXILLARY ANTROSTOMY WITH TISSUE REMOVAL;  Surgeon: Carloyn Manner, MD;  Location: [place];   Service: ENT;  Laterality: Left;  ? ? ?Prior to Admission medications   ?Medication Sig Start Date End Date Taking? Authorizing Provider  ?clonazePAM (KLONOPIN) 0.5 MG tablet Take 0.5 mg by mouth 2 (two) times daily as needed for anxiety.   Yes [provider]  ?empagliflozin (JARDIANCE) 10 MG TABS tablet Take 10 mg by mouth daily.   Yes [provider]  ?FLUoxetine (PROZAC) 40 MG capsule Take 40 mg by mouth daily. AM   Yes [provider]  ?gabapentin (NEURONTIN) 600 MG tablet Take 600 mg by mouth 3 (three) times daily. 600 mg AM, 600 mg lunchtime, 1200 mg PM   Yes [provider]  ?levothyroxine (SYNTHROID) 100 MCG tablet Take 100 mcg by mouth daily before breakfast.   Yes [provider]  ?lisinopril (ZESTRIL) 10 MG tablet Take 10 mg by mouth daily.   Yes [provider]  ?loperamide (IMODIUM A-D) 2 MG tablet Take 2 mg by mouth 4 (four) times daily as needed for diarrhea or loose stools.   Yes [provider]  ?meclizine (ANTIVERT) 25 MG tablet Take 25 mg by mouth 3 (three) times daily as needed for dizziness.   Yes [provider]  ?metFORMIN (GLUCOPHAGE) 1000 MG tablet Take 1,000 mg by mouth 2 (two) times daily with a meal.   Yes [provider]  ?metoprolol succinate (TOPROL-XL) 25 MG 24 hr tablet Take 25  mg by mouth daily.   Yes [provider]  ?pregabalin (LYRICA) 50 MG capsule Take 50 mg by mouth 4 (four) times daily as needed.   Yes [provider]  ?rOPINIRole (REQUIP) 0.5 MG tablet Take 0.5 mg by mouth at bedtime.   Yes [provider]  ?traMADol (ULTRAM) 50 MG tablet Take 50-100 mg by mouth every 6 (six) hours as needed.   Yes [provider]  ? ? ?Allergies as of 03/30/2021 - Review Complete 08/18/2017  ?Allergen Reaction Noted  ? Keflex [cephalexin] Itching 07/01/2015  ? Clindamycin/lincomycin Rash 03/06/2017  ? ? ?Family History  ?Problem Relation Age of Onset  ? Stroke Mother   ?  Breast cancer Neg Hx   ? ? ?Social History  ? ?Socioeconomic History  ? Marital status: Widowed  ?  Spouse name: Not on file  ? Number of children: Not on file  ? Years of education: Not on file  ? Highest education level: Not on file  ?Occupational History  ? Not on file  ?Tobacco Use  ? Smoking status: Never  ? Smokeless tobacco: Never  ?Vaping Use  ? Vaping Use: Never used  ?Substance and Sexual Activity  ? Alcohol use: No  ? Drug use: No  ? Sexual activity: Not on file  ?Other Topics Concern  ? Not on file  ?Social History Narrative  ? Not on file  ? ?Social Determinants of Health  ? ?Financial Resource Strain: Not on file  ?Food Insecurity: Not on file  ?Transportation Needs: Not on file  ?Physical Activity: Not on file  ?Stress: Not on file  ?Social Connections: Not on file  ?Intimate Partner Violence: Not on file  ? ? ?Review of Systems: ?See HPI, otherwise negative ROS ? ?Physical Exam: ?BP (!) 141/72   Pulse 70   Temp 98.4 ?F (36.9 ?C) (Temporal)   Ht '5\' 3"'$  (1.6 m)   Wt 79.8 kg   SpO2 97%   BMI 31.18 kg/m?  ?General:   Alert, cooperative in NAD ?Head:  Normocephalic and atraumatic. ?Respiratory:  Normal work of breathing. ?Cardiovascular:  RRR ? ?Impression/Plan: ?Kelly Wells is here for cataract surgery. ? ?Risks, benefits, limitations, and alternatives regarding cataract surgery have been reviewed with the patient.  Questions have been answered.  All parties agreeable. ? ? ?Benay Pillow, MD  05/08/2021, 10:00 AM ? ? ?

## 2021-05-08 NOTE — Op Note (Signed)
OPERATIVE NOTE ? ?PRESLY STEINRUCK ?[id number] ?05/08/2021 ? ? ?PREOPERATIVE DIAGNOSIS:  Nuclear sclerotic cataract left eye.  H25.12 ?  ?POSTOPERATIVE DIAGNOSIS:    Nuclear sclerotic cataract left eye.   ?  ?PROCEDURE:  Phacoemusification with posterior chamber intraocular lens placement of the left eye  ? ?LENS:   ?Implant Name Type Inv. Item Serial No. Manufacturer Lot No. LRB No. Used Action  ?LENS IOL TECNIS EYHANCE 24.5 - [id number] Intraocular Lens LENS IOL TECNIS EYHANCE 24.5 [id number] [employer]  Left 1 Implanted  ?    ?Procedure(s) with comments: ?CATARACT EXTRACTION PHACO AND INTRAOCULAR LENS PLACEMENT (IOC) LEFT DIABETIC 3.24 00:28.5 (Left) - Diabetic ? ?DIB00 +24.5 ?  ?ULTRASOUND TIME: 0 minutes 28 seconds.  CDE 3.24 ?  ?SURGEON:  Benay Pillow, MD, MPH ?  ?ANESTHESIA:  Topical with tetracaine drops augmented with 1% preservative-free intracameral lidocaine. ? ?ESTIMATED BLOOD LOSS: <1 mL ?  ?COMPLICATIONS:  None. ?  ?DESCRIPTION OF PROCEDURE:  The patient was identified in the holding room and transported to the operating room and placed in the supine position under the operating microscope.  The left eye was identified as the operative eye and it was prepped and draped in the usual sterile ophthalmic fashion. ?  ?A 1.0 millimeter clear-corneal paracentesis was made at the 5:00 position. 0.5 ml of preservative-free 1% lidocaine with epinephrine was injected into the anterior chamber. ? The anterior chamber was filled with Healon 5 viscoelastic.  A 2.4 millimeter keratome was used to make a near-clear corneal incision at the 2:00 position.  A curvilinear capsulorrhexis was made with a cystotome and capsulorrhexis forceps.  Balanced salt solution was used to hydrodissect and hydrodelineate the nucleus. ?  ?Phacoemulsification was then used in stop and chop fashion to remove the lens nucleus and epinucleus.  The remaining cortex was then removed using the irrigation and aspiration handpiece. Healon was then  placed into the capsular bag to distend it for lens placement.  A lens was then injected into the capsular bag.  The remaining viscoelastic was aspirated. ?  ?Wounds were hydrated with balanced salt solution.  The anterior chamber was inflated to a physiologic pressure with balanced salt solution. ? ?Intracameral vigamox 0.1 mL undiltued was injected into the eye and a drop placed onto the ocular surface. ? No wound leaks were noted.  The patient was taken to the recovery room in stable condition without complications of anesthesia or surgery ? ?Benay Pillow ?05/08/2021, 10:24 AM ? ?

## 2021-05-08 NOTE — Transfer of Care (Signed)
Immediate Anesthesia Transfer of Care Note ? ?Patient: Kelly Wells ? ?Procedure(s) Performed: CATARACT EXTRACTION PHACO AND INTRAOCULAR LENS PLACEMENT (IOC) LEFT DIABETIC 3.24 00:28.5 (Left: Eye) ? ?Patient Location: PACU ? ?Anesthesia Type: MAC ? ?Level of Consciousness: awake, alert  and patient cooperative ? ?Airway and Oxygen Therapy: Patient Spontanous Breathing and Patient connected to supplemental oxygen ? ?Post-op Assessment: Post-op Vital signs reviewed, Patient's Cardiovascular Status Stable, Respiratory Function Stable, Patent Airway and No signs of Nausea or vomiting ? ?Post-op Vital Signs: Reviewed and stable ? ?Complications: No notable events documented. ? ?

## 2021-05-08 NOTE — Anesthesia Postprocedure Evaluation (Signed)
Anesthesia Post Note ? ?Patient: Kelly Wells ? ?Procedure(s) Performed: CATARACT EXTRACTION PHACO AND INTRAOCULAR LENS PLACEMENT (IOC) LEFT DIABETIC 3.24 00:28.5 (Left: Eye) ? ? ?  ?Patient location during evaluation: PACU ?Anesthesia Type: MAC ?Level of consciousness: awake and alert ?Pain management: pain level controlled ?Vital Signs Assessment: post-procedure vital signs reviewed and stable ?Respiratory status: spontaneous breathing, nonlabored ventilation, respiratory function stable and patient connected to nasal cannula oxygen ?Cardiovascular status: stable and blood pressure returned to baseline ?Postop Assessment: no apparent nausea or vomiting ?Anesthetic complications: no ? ? ?No notable events documented. ? ?Skie Vitrano A  Bryelle Spiewak ? ? ? ? ? ?

## 2021-05-08 NOTE — Anesthesia Procedure Notes (Signed)
Procedure Name: [place] ?Date/Time: 05/08/2021 10:08 AM ?Performed by: Jeannene Patella, CRNA ?Pre-anesthesia Checklist: Patient identified, Emergency Drugs available, Suction available, Timeout performed and Patient being monitored ?Patient Re-evaluated:Patient Re-evaluated prior to induction ?Oxygen Delivery Method: Nasal cannula ?Placement Confirmation: positive ETCO2 ? ? ? ? ?

## 2021-05-09 ENCOUNTER — Encounter: Payer: Self-pay | Admitting: Ophthalmology

## 2021-08-17 ENCOUNTER — Other Ambulatory Visit: Payer: Self-pay | Admitting: Physician Assistant

## 2021-08-17 DIAGNOSIS — R4182 Altered mental status, unspecified: Secondary | ICD-10-CM

## 2021-08-28 ENCOUNTER — Ambulatory Visit
Admission: RE | Admit: 2021-08-28 | Discharge: 2021-08-28 | Disposition: A | Discharge status: Home / Self Care | Payer: Medicare Other | Source: Ambulatory Visit | Attending: Physician Assistant | Admitting: Physician Assistant

## 2021-08-28 DIAGNOSIS — R4182 Altered mental status, unspecified: Secondary | ICD-10-CM | POA: Insufficient documentation

## 2021-10-17 ENCOUNTER — Ambulatory Visit
Admission: EM | Admit: 2021-10-17 | Discharge: 2021-10-17 | Disposition: A | Discharge status: Home / Self Care | Payer: Medicare Other | Attending: Physician Assistant | Admitting: Physician Assistant

## 2021-10-17 ENCOUNTER — Ambulatory Visit (INDEPENDENT_AMBULATORY_CARE_PROVIDER_SITE_OTHER): Payer: Medicare Other

## 2021-10-17 ENCOUNTER — Encounter: Payer: Self-pay | Admitting: Emergency Medicine

## 2021-10-17 DIAGNOSIS — R059 Cough, unspecified: Secondary | ICD-10-CM | POA: Diagnosis not present

## 2021-10-17 DIAGNOSIS — M549 Dorsalgia, unspecified: Secondary | ICD-10-CM | POA: Diagnosis not present

## 2021-10-17 DIAGNOSIS — M546 Pain in thoracic spine: Secondary | ICD-10-CM | POA: Diagnosis not present

## 2021-10-17 DIAGNOSIS — G8929 Other chronic pain: Secondary | ICD-10-CM | POA: Diagnosis not present

## 2021-10-17 DIAGNOSIS — R051 Acute cough: Secondary | ICD-10-CM

## 2021-10-17 MED ORDER — BACLOFEN 10 MG PO TABS: 10.0000 mg | ORAL_TABLET | Freq: Three times a day (TID) | ORAL | 0 refills | Status: DC | PRN

## 2021-10-17 NOTE — ED Triage Notes (Signed)
Pt presents with mid back pain x 3-4 weeks. She was removing grass from her lawnmower and when she bend over to the lift the bag she felt pain.

## 2021-10-17 NOTE — ED Provider Notes (Signed)
[facility]    CSN: [id number] Arrival date & time: 10/17/21  1621      History   Chief Complaint Chief Complaint  Patient presents with   Back Pain    HPI Kelly Wells is a 76 y.o. female presenting for approximately 2-week history of thoracic back pain.  She says that the entire upper back hurts but more so on the right side.  She says pain started when she was bending over picking up a bag full of grass from her lawnmower.  Reports the pain has been persistent over the past couple of weeks.  She says sometimes moving makes the pain worse.  It does not radiate.  She reports that she started to develop a dry cough around the same time.  She does not have any pleuritic pain or shortness of breath.  Has not had any fevers, nasal congestion or upper respiratory/sinus issues.  Patient has been taking NSAIDs and Tylenol for the pain but says it has not really helped.  She does have a history of chronic lower back pain and was scheduled to have an injection recently but did not have it performed.  She is not experiencing any lower back pain at this time or pain in lower extremities, numbness, tingling or weakness.  No other complaints today.  HPI  Past Medical History:  Diagnosis Date   Anemia    Anxiety    Arthritis    KNEES,HANDS   Baker's cyst of knee, right    Depression    Diabetes mellitus without complication ([place])    type 2   Headache    SINUS ISSUES   Hyperlipidemia    Hypertension    Hypothyroidism    Sinusitis    chronic, with nasal obstruction,tinnitis, and hearing loss   Sleep apnea    NO CPAP   Vertigo    4 day episode end of March 2023    There are no problems to display for this patient.   Past Surgical History:  Procedure Laterality Date   ABDOMINAL HYSTERECTOMY     BREAST BIOPSY Bilateral 1980's   sugical bx    CARDIAC CATHETERIZATION     YEARS AGO   CATARACT EXTRACTION W/PHACO Right 04/17/2021   Procedure: CATARACT EXTRACTION PHACO AND  INTRAOCULAR LENS PLACEMENT (IOC) RIGHT DIABETIC 4.90 00:40.5;  Surgeon: Eulogio Bear, MD;  Location: [facility];  Service: Ophthalmology;  Laterality: Right;  Diabetic   CATARACT EXTRACTION W/PHACO Left 05/08/2021   Procedure: CATARACT EXTRACTION PHACO AND INTRAOCULAR LENS PLACEMENT (IOC) LEFT DIABETIC 3.24 00:28.5;  Surgeon: Eulogio Bear, MD;  Location: [facility];  Service: Ophthalmology;  Laterality: Left;  Diabetic   CHOLECYSTECTOMY     COLONOSCOPY WITH PROPOFOL N/A 07/04/2015   Procedure: COLONOSCOPY WITH PROPOFOL;  Surgeon: Manya Silvas, MD;  Location: [facility] ENDOSCOPY;  Service: Endoscopy;  Laterality: N/A;   ETHMOIDECTOMY Left 03/13/2017   Procedure: ANTERIOR ETHMOIDECTOMY WITH FRONTAL SINUSOTOMY;  Surgeon: Carloyn Manner, MD;  Location: [place];  Service: ENT;  Laterality: Left;   IMAGE GUIDED SINUS SURGERY Left 03/13/2017   Procedure: IMAGE GUIDED SINUS SURGERY;  Surgeon: Carloyn Manner, MD;  Location: [place];  Service: ENT;  Laterality: Left;  NEED STRYKER DISK DIABETIC/ SLEEP APNEA-no CPAP GAVE DISK TO CECE 2-28   JOINT REPLACEMENT Left 2005   knee   MAXILLARY ANTROSTOMY Left 03/13/2017   Procedure: MAXILLARY ANTROSTOMY WITH TISSUE REMOVAL;  Surgeon: Carloyn Manner, MD;  Location: [facility];  Service: ENT;  Laterality: Left;    OB History   No obstetric history on file.      Home Medications    Prior to Admission medications   Medication Sig Start Date End Date Taking? Authorizing Provider  baclofen (LIORESAL) 10 MG tablet Take 1 tablet (10 mg total) by mouth 3 (three) times daily as needed for muscle spasms. 10/17/21  Yes Danton Clap, PA-C  clonazePAM (KLONOPIN) 0.5 MG tablet Take 0.5 mg by mouth 2 (two) times daily as needed for anxiety.    [provider]  empagliflozin (JARDIANCE) 10 MG TABS tablet Take 10 mg by mouth daily.    [provider]  FLUoxetine (PROZAC) 40 MG capsule Take  40 mg by mouth daily. AM    [provider]  gabapentin (NEURONTIN) 600 MG tablet Take 600 mg by mouth 3 (three) times daily. 600 mg AM, 600 mg lunchtime, 1200 mg PM    [provider]  levothyroxine (SYNTHROID) 100 MCG tablet Take 100 mcg by mouth daily before breakfast.    [provider]  lisinopril (ZESTRIL) 10 MG tablet Take 10 mg by mouth daily.    [provider]  loperamide (IMODIUM A-D) 2 MG tablet Take 2 mg by mouth 4 (four) times daily as needed for diarrhea or loose stools.    [provider]  meclizine (ANTIVERT) 25 MG tablet Take 25 mg by mouth 3 (three) times daily as needed for dizziness.    [provider]  metFORMIN (GLUCOPHAGE) 1000 MG tablet Take 1,000 mg by mouth 2 (two) times daily with a meal.    [provider]  metoprolol succinate (TOPROL-XL) 25 MG 24 hr tablet Take 25 mg by mouth daily.    [provider]  pregabalin (LYRICA) 50 MG capsule Take 50 mg by mouth 4 (four) times daily as needed.    [provider]  rOPINIRole (REQUIP) 0.5 MG tablet Take 0.5 mg by mouth at bedtime.    [provider]  traMADol (ULTRAM) 50 MG tablet Take 50-100 mg by mouth every 6 (six) hours as needed.    [provider]    Family History Family History  Problem Relation Age of Onset   Stroke Mother    Breast cancer Neg Hx     Social History Social History   Tobacco Use   Smoking status: Never   Smokeless tobacco: Never  Vaping Use   Vaping Use: Never used  Substance Use Topics   Alcohol use: No   Drug use: No     Allergies   Keflex [cephalexin] and Clindamycin/lincomycin   Review of Systems Review of Systems  Constitutional:  Negative for fatigue and fever.  HENT:  Negative for congestion.   Respiratory:  Positive for cough. Negative for shortness of breath and wheezing.   Cardiovascular:  Negative for chest pain.  Gastrointestinal:  Negative for abdominal pain.   Genitourinary:  Negative for difficulty urinating, dysuria and hematuria.  Musculoskeletal:  Positive for back pain. Negative for arthralgias, gait problem and neck pain.  Neurological:  Negative for weakness and numbness.     Physical Exam Triage Vital Signs ED Triage Vitals  Enc Vitals Group     BP 10/17/21 1705 100/67     Pulse Rate 10/17/21 1705 64     Resp 10/17/21 1705 16     Temp 10/17/21 1705 98.1 F (36.7 C)     Temp Source 10/17/21 1705 Oral  SpO2 10/17/21 1705 93 %     Weight --      Height --      Head Circumference --      Peak Flow --      Pain Score 10/17/21 1703 10     Pain Loc --      Pain Edu? --      Excl. in [place]? --    No data found.  Updated Vital Signs BP 100/67 (BP Location: Left Arm)   Pulse 64   Temp 98.1 F (36.7 C) (Oral)   Resp 16   SpO2 93%     Physical Exam Vitals and nursing note reviewed.  Constitutional:      General: She is not in acute distress.    Appearance: Normal appearance. She is not ill-appearing or toxic-appearing.  HENT:     Head: Normocephalic and atraumatic.     Nose: Nose normal.     Mouth/Throat:     Mouth: Mucous membranes are moist.     Pharynx: Oropharynx is clear.  Eyes:     General: No scleral icterus.       Right eye: No discharge.        Left eye: No discharge.     Conjunctiva/sclera: Conjunctivae normal.  Cardiovascular:     Rate and Rhythm: Normal rate and regular rhythm.     Heart sounds: Normal heart sounds.  Pulmonary:     Effort: Pulmonary effort is normal. No respiratory distress.     Breath sounds: Normal breath sounds.  Abdominal:     Palpations: Abdomen is soft.     Tenderness: There is no abdominal tenderness. There is no right CVA tenderness or left CVA tenderness.  Musculoskeletal:     Cervical back: Neck supple.     Thoracic back: Tenderness (TTP throughout the thoracic spinal processes and bilateral parathoracic muscles (R>L)) present. Decreased range of motion.  Skin:     General: Skin is dry.  Neurological:     General: No focal deficit present.     Mental Status: She is alert. Mental status is at baseline.     Motor: No weakness.     Gait: Gait normal.  Psychiatric:        Mood and Affect: Mood normal.        Behavior: Behavior normal.        Thought Content: Thought content normal.      UC Treatments / Results  Labs (all labs ordered are listed, but only abnormal results are displayed) Labs Reviewed - No data to display  EKG   Radiology DG Chest 2 View  Result Date: 10/17/2021 CLINICAL DATA:  thoracic back pain and dry cough x 2 weeks. felt back pain when she picked something up initially EXAM: CHEST - 2 VIEW COMPARISON:  Chest x-ray 01/21/2014 FINDINGS: The heart and mediastinal contours are unchanged. Aortic calcification. Bilateral linear atelectasis versus scarring. No focal consolidation. No pulmonary edema. No pleural effusion. No pneumothorax. No acute osseous abnormality. IMPRESSION: No active cardiopulmonary disease. Electronically Signed   By: Iven Finn M.D.   On: 10/17/2021 17:52    Procedures Procedures (including critical care time)  Medications Ordered in UC Medications - No data to display  Initial Impression / Assessment and Plan / UC Course  I have reviewed the triage vital signs and the nursing notes.  Pertinent labs & imaging results that were available during my care of the patient were reviewed by me and considered in my medical  decision making (see chart for details).   76 y/o female presents for 2 week history of atraumatic thoracic back pain and dry cough.  Back pain began immediately after she bent forward to pick up a bag of leaves.  Pain is not improved with supportive care and over-the-counter medicines.  History of chronic low back pain.  In regards to the cough she has not had any sputum production, fevers or shortness of breath.  No pleuritic pain.  Vitals are stable.  She is overall well-appearing.  She  does have tenderness diffusely throughout the thoracic spinous processes and bilateral parathoracic muscles-greater on the right side than the left.  Increased pain with forward flexion.  No tenderness of the low back at this time.  Her chest is clear to auscultation heart regular rate and rhythm.  Chest x-ray obtained.  No evidence of infiltrate/pneumonia, pneumothorax or rib fractures or compression/thoracic fractures.  Discussed results of x-ray with patient and her daughter.  Advised that her symptoms are consistent with muscle strain and probably related to her underlying chronic back pain.  Advised continuing the medications that she has been prescribed.  She takes both gabapentin and Lyrica as well as Tylenol and occasional ibuprofen.  Advised her to also consider lidocaine patches, heat and ice.  Sent baclofen to try as well.  Advised her to contact pain management for further evaluation and management of her condition.  ER precautions discussed.  In regards to the cough, this seems to be due to viral illness.  Advised over-the-counter cough medication and return if fever, worsening cough or shortness of breath.   Final Clinical Impressions(s) / UC Diagnoses   Final diagnoses:  Acute bilateral thoracic back pain  Acute cough  Chronic back pain, unspecified back location, unspecified back pain laterality     Discharge Instructions      -Fractures of your back and no evidence of pneumonia. - Follow-up with pain management.  BACK PAIN: Stressed avoiding painful activities . RICE (REST, ICE, COMPRESSION, ELEVATION) guidelines reviewed. May alternate ice and heat. Consider use of muscle rubs, Salonpas patches, etc. Use medications as directed including muscle relaxers if prescribed. Take anti-inflammatory medications as prescribed or OTC NSAIDs/Tylenol.  F/u with PCP in 7-10 days for reexamination, and please feel free to call or return to the urgent care at any time for any questions or  concerns you may have and we will be happy to help you!   BACK PAIN RED FLAGS: If the back pain acutely worsens or there are any red flag symptoms such as numbness/tingling, leg weakness, saddle anesthesia, or loss of bowel/bladder control, go immediately to the ER. Follow up with [place] as scheduled or sooner if the pain does not begin to resolve or if it worsens before the follow up    URI/COLD SYMPTOMS: Your exam today is consistent with a viral illness. Antibiotics are not indicated at this time. Use medications as directed, including cough syrup, nasal saline, and decongestants. Your symptoms should improve over the next few days and resolve within 7-10 days. Increase rest and fluids. F/u if symptoms worsen or predominate such as sore throat, ear pain, productive cough, shortness of breath, or if you develop high fevers or worsening fatigue over the next several days.       ED Prescriptions     Medication Sig Dispense Auth. Provider   baclofen (LIORESAL) 10 MG tablet Take 1 tablet (10 mg total) by mouth 3 (three) times daily as needed for muscle spasms.  30 each Danton Clap, PA-C      I have reviewed the PDMP during this encounter.   Danton Clap, PA-C 10/17/21 1816

## 2021-10-17 NOTE — Discharge Instructions (Signed)
-  Fractures of your back and no evidence of pneumonia. - Follow-up with pain management.  BACK PAIN: Stressed avoiding painful activities . RICE (REST, ICE, COMPRESSION, ELEVATION) guidelines reviewed. May alternate ice and heat. Consider use of muscle rubs, Salonpas patches, etc. Use medications as directed including muscle relaxers if prescribed. Take anti-inflammatory medications as prescribed or OTC NSAIDs/Tylenol.  F/u with PCP in 7-10 days for reexamination, and please feel free to call or return to the urgent care at any time for any questions or concerns you may have and we will be happy to help you!   BACK PAIN RED FLAGS: If the back pain acutely worsens or there are any red flag symptoms such as numbness/tingling, leg weakness, saddle anesthesia, or loss of bowel/bladder control, go immediately to the ER. Follow up with [place] as scheduled or sooner if the pain does not begin to resolve or if it worsens before the follow up    URI/COLD SYMPTOMS: Your exam today is consistent with a viral illness. Antibiotics are not indicated at this time. Use medications as directed, including cough syrup, nasal saline, and decongestants. Your symptoms should improve over the next few days and resolve within 7-10 days. Increase rest and fluids. F/u if symptoms worsen or predominate such as sore throat, ear pain, productive cough, shortness of breath, or if you develop high fevers or worsening fatigue over the next several days.

## 2021-10-30 ENCOUNTER — Ambulatory Visit
Admission: RE | Admit: 2021-10-30 | Discharge: 2021-10-30 | Disposition: A | Discharge status: Home / Self Care | Payer: Medicare Other | Source: Ambulatory Visit | Attending: Family Medicine | Admitting: Family Medicine

## 2021-10-30 ENCOUNTER — Other Ambulatory Visit: Payer: Self-pay | Admitting: Family Medicine

## 2021-10-30 DIAGNOSIS — S22000A Wedge compression fracture of unspecified thoracic vertebra, initial encounter for closed fracture: Secondary | ICD-10-CM | POA: Diagnosis present

## 2021-10-31 ENCOUNTER — Other Ambulatory Visit: Payer: Self-pay | Admitting: Family Medicine

## 2021-10-31 DIAGNOSIS — M8448XA Pathological fracture, other site, initial encounter for fracture: Secondary | ICD-10-CM

## 2021-11-07 ENCOUNTER — Other Ambulatory Visit: Payer: Self-pay | Admitting: Family Medicine

## 2021-11-07 ENCOUNTER — Ambulatory Visit
Admission: RE | Admit: 2021-11-07 | Discharge: 2021-11-07 | Disposition: A | Discharge status: Home / Self Care | Payer: Medicare Other | Source: Ambulatory Visit | Attending: Infectious Diseases | Admitting: Infectious Diseases

## 2021-11-07 DIAGNOSIS — S22050A Wedge compression fracture of T5-T6 vertebra, initial encounter for closed fracture: Secondary | ICD-10-CM

## 2021-11-07 DIAGNOSIS — M8448XA Pathological fracture, other site, initial encounter for fracture: Secondary | ICD-10-CM

## 2021-11-07 HISTORY — PX: IR RADIOLOGIST EVAL & MGMT: IMG5224

## 2021-11-07 NOTE — H&P (Signed)
Interventional Radiology - Clinic Visit, Initial H&P    Referring Provider: Leonel Ramsay, MD  Reason for Visit: Thoracic compression fractures     History of Present Illness  Kelly Wells is a 76 y.o. female with a relevant past medical history of old L1 compression fracture seen today in Interventional Radiology clinic for new thoracic vertebral body compression fractures.  Patient reports the onset of new upper back pain following yard work earlier this year in September.  She reports that she was picking up bags of grass to put up into trashcans, when she felt something "snap".  MRI performed on October 30, 2021 demonstrated subacute compression fractures of the T5 and T6 vertebral bodies, with approximately 20% height loss at T5.    She reports that her current pain is severe 9/10, and she takes prescription hydrocodone 3 times daily with only minimal improvement in pain to 8/10.  She reports significant disability on the [employer] disability questionnaire with 18/24 positive.   The patient reports an old L1 compression fracture after falling off a ladder 4 years ago.     Additional Past Medical History Past Medical History:  Diagnosis Date   Anemia    Anxiety    Arthritis    KNEES,HANDS   Baker's cyst of knee, right    Depression    Diabetes mellitus without complication (HCC)    type 2   Headache    SINUS ISSUES   Hyperlipidemia    Hypertension    Hypothyroidism    Sinusitis    chronic, with nasal obstruction,tinnitis, and hearing loss   Sleep apnea    NO CPAP   Vertigo    4 day episode end of March 2023     Surgical History  Past Surgical History:  Procedure Laterality Date   ABDOMINAL HYSTERECTOMY     BREAST BIOPSY Bilateral 1980's   sugical bx    CARDIAC CATHETERIZATION     YEARS AGO   CATARACT EXTRACTION W/PHACO Right 04/17/2021   Procedure: CATARACT EXTRACTION PHACO AND INTRAOCULAR LENS PLACEMENT (IOC) RIGHT DIABETIC 4.90 00:40.5;  Surgeon:  Eulogio Bear, MD;  Location: [place];  Service: Ophthalmology;  Laterality: Right;  Diabetic   CATARACT EXTRACTION W/PHACO Left 05/08/2021   Procedure: CATARACT EXTRACTION PHACO AND INTRAOCULAR LENS PLACEMENT (IOC) LEFT DIABETIC 3.24 00:28.5;  Surgeon: Eulogio Bear, MD;  Location: [place];  Service: Ophthalmology;  Laterality: Left;  Diabetic   CHOLECYSTECTOMY     COLONOSCOPY WITH PROPOFOL N/A 07/04/2015   Procedure: COLONOSCOPY WITH PROPOFOL;  Surgeon: Manya Silvas, MD;  Location: [facility] ENDOSCOPY;  Service: Endoscopy;  Laterality: N/A;   ETHMOIDECTOMY Left 03/13/2017   Procedure: ANTERIOR ETHMOIDECTOMY WITH FRONTAL SINUSOTOMY;  Surgeon: Carloyn Manner, MD;  Location: [place];  Service: ENT;  Laterality: Left;   IMAGE GUIDED SINUS SURGERY Left 03/13/2017   Procedure: IMAGE GUIDED SINUS SURGERY;  Surgeon: Carloyn Manner, MD;  Location: [place];  Service: ENT;  Laterality: Left;  NEED STRYKER DISK DIABETIC/ SLEEP APNEA-no CPAP GAVE DISK TO CECE 2-28   JOINT REPLACEMENT Left 2005   knee   MAXILLARY ANTROSTOMY Left 03/13/2017   Procedure: MAXILLARY ANTROSTOMY WITH TISSUE REMOVAL;  Surgeon: Carloyn Manner, MD;  Location: [place];  Service: ENT;  Laterality: Left;     Medications  I have reviewed the current medication list. Refer to chart for details. Current Outpatient Medications  Medication Instructions   baclofen (LIORESAL) 10 mg, Oral, 3 times daily  PRN   clonazePAM (KLONOPIN) 0.5 mg, Oral, 2 times daily PRN   empagliflozin (JARDIANCE) 10 mg, Oral, Daily   FLUoxetine (PROZAC) 40 mg, Oral, Daily, AM   gabapentin (NEURONTIN) 600 mg, Oral, 3 times daily, 600 mg AM, 600 mg lunchtime, 1200 mg PM   levothyroxine (SYNTHROID) 100 mcg, Oral, Daily before breakfast   lisinopril (ZESTRIL) 10 mg, Oral, Daily   loperamide (IMODIUM A-D) 2 mg, Oral, 4 times daily PRN   meclizine (ANTIVERT) 25 mg, Oral, 3 times daily PRN    metFORMIN (GLUCOPHAGE) 1,000 mg, Oral, 2 times daily with meals   metoprolol succinate (TOPROL-XL) 25 mg, Oral, Daily   pregabalin (LYRICA) 50 mg, Oral, 4 times daily PRN   rOPINIRole (REQUIP) 0.5 mg, Oral, Daily at bedtime   traMADol (ULTRAM) 50-100 mg, Oral, Every 6 hours PRN      Allergies Allergies  Allergen Reactions   Keflex [Cephalexin] Itching   Clindamycin/Lincomycin Rash   Does patient have contrast allergy: No     Physical Exam Current Vitals Temp: 97.8 F (36.6 C) (Temp Source: Oral)  Pulse Rate: 64  Resp: 16  BP: 127/71  SpO2: 96 %        There is no height or weight on file to calculate BMI.  General: Alert and answers questions appropriately.  Cardiac: Regular rate. No dependent edema. Pulmonary: Normal work of breathing. On room air. Back: Point tenderness to palpation in the upper back.    Pertinent Lab Results    Latest Ref Rng & Units 07/08/2017    9:12 AM 01/21/2014    3:52 PM 11/17/2013    5:30 PM  CBC  WBC 3.6 - 11.0 K/uL 7.0  8.6  10.1   Hemoglobin 12.0 - 16.0 g/dL 12.4  12.0  11.8   Hematocrit 35.0 - 47.0 % 37.2  36.7  35.4   Platelets 150 - 440 K/uL 286  197  217       Latest Ref Rng & Units 07/08/2017    9:12 AM 01/21/2014    3:52 PM 11/17/2013    5:30 PM  CMP  Glucose 70 - 99 mg/dL 110  165  134   BUN 8 - 23 mg/dL '29  9  14   '$ Creatinine 0.44 - 1.00 mg/dL 1.64  0.89  1.06   Sodium 135 - 145 mmol/L 135  138  135   Potassium 3.5 - 5.1 mmol/L 4.4  3.8  4.1   Chloride 98 - 111 mmol/L 103  101  99   CO2 22 - 32 mmol/L '23  31  27   '$ Calcium 8.9 - 10.3 mg/dL 9.2  8.3  9.2   Total Protein 6.5 - 8.1 g/dL 8.2  6.9  8.2   Total Bilirubin 0.3 - 1.2 mg/dL 0.7  0.6  2.2   Alkaline Phos 38 - 126 U/L 219  99  131   AST 15 - 41 U/L '29  20  20   '$ ALT 0 - 44 U/L 22  20  34       Relevant and/or Recent Imaging: MRI T spine 10/30/21  IMPRESSION: 1. Subacute superior endplate fracture at T5 with loss of height anteriorly of 20%. No retropulsed  bone. Edema throughout the vertebral body. This does not appear to have progressed since the lateral chest radiograph of 10/17/2021. 2. Minor superior endplate depression at T6 with bone marrow edema but no measurable loss of height. 3. Old compression fracture at L1 with loss of height anteriorly  of 80% and 1 cm of retropulsion. Effacement of the subarachnoid space but no frank compression of the distal cord/conus. This is unchanged since the MRI of January 2021. 4. These results will be called to the ordering clinician or representative by the Radiologist Assistant, and communication documented in the PACS or [employer]. Electronically Signed By: Nelson Chimes M.D. On: 10/30/2021 12:50   Scoliosis XR 01/28/2019  Personally reviewed of purposes of bone density. Patient is osteopenic.      Assessment & Plan:   Patient has suffered subacute osteoporotic fracture of the T5, T6 vertebra.   History and exam have demonstrated the following:  Acute/Subacute fracture by imaging dated 10/30/2021, Pain on exam concordant with level of fracture, Failure of conservative therapy and pain refractory to narcotic pain mediation, and Significant disability on the [place] with 18/24 positive symptoms, reflecting significant impact/impairment of (ADLs)   ICD-10-CM Codes that Support Medical Necessity ([email or web address]?[email or web address])  M80.08XA    Age-related osteoporosis with current pathological fracture, vertebra(e), initial encounter for fracture   Plan:  T5, T6 vertebral body augmentation with balloon kyphoplasty  Post-procedure disposition: outpatient DRI-A  Medication holds: none   The patient has suffered a fracture of the T5, T6 vertebral body. It is recommended that patients aged 33 years or older be evaluated for possible testing or treatment of osteoporosis. A copy of this consult report is sent to the  patient's referring physician.  Advanced Care Plan: The patient did not want to provide an [place] at the time of this visit     Total time spent on today's visit was over 56 Minutes, including both face-to-face time and non face-to-face time, personally spent on review of chart (including labs and relevant imaging), discussing further workup and treatment options, referral to specialist if needed, reviewing outside records if pertinent, answering patient questions, and coordinating care regarding thoracic compression fractures as well as management strategy.      Albin Felling, MD  Vascular and Interventional Radiology 11/07/2021 9:39 AM

## 2021-11-16 ENCOUNTER — Ambulatory Visit
Admission: RE | Admit: 2021-11-16 | Discharge: 2021-11-16 | Disposition: A | Discharge status: Home / Self Care | Payer: Medicare Other | Source: Ambulatory Visit | Attending: Family Medicine | Admitting: Family Medicine

## 2021-11-16 ENCOUNTER — Other Ambulatory Visit: Payer: Self-pay | Admitting: Family Medicine

## 2021-11-16 DIAGNOSIS — S22050A Wedge compression fracture of T5-T6 vertebra, initial encounter for closed fracture: Secondary | ICD-10-CM

## 2021-11-16 HISTORY — PX: IR KYPHO EA ADDL LEVEL THORACIC OR LUMBAR: IMG5520

## 2021-11-16 HISTORY — PX: IR KYPHO THORACIC WITH BONE BIOPSY: IMG5518

## 2021-11-16 MED ORDER — MIDAZOLAM HCL 2 MG/2ML IJ SOLN
1.0000 mg | INTRAMUSCULAR | Status: DC | PRN
  Administered 2021-11-16: 1 mg via INTRAVENOUS
  Administered 2021-11-16: 2 mg via INTRAVENOUS
  Administered 2021-11-16 (×4): 1 mg via INTRAVENOUS

## 2021-11-16 MED ORDER — VANCOMYCIN HCL IN DEXTROSE 1-5 GM/200ML-% IV SOLN
1000.0000 mg | INTRAVENOUS | Status: AC
  Administered 2021-11-16: 1000 mg via INTRAVENOUS

## 2021-11-16 MED ORDER — ACETAMINOPHEN 10 MG/ML IV SOLN
1000.0000 mg | Freq: Once | INTRAVENOUS | Status: AC
  Administered 2021-11-16: 1000 mg via INTRAVENOUS

## 2021-11-16 MED ORDER — FENTANYL CITRATE PF 50 MCG/ML IJ SOSY
25.0000 ug | PREFILLED_SYRINGE | INTRAMUSCULAR | Status: DC | PRN
  Administered 2021-11-16 (×2): 50 ug via INTRAVENOUS
  Administered 2021-11-16 (×2): 25 ug via INTRAVENOUS
  Administered 2021-11-16 (×2): 50 ug via INTRAVENOUS

## 2021-11-16 MED ORDER — SODIUM CHLORIDE 0.9 % IV SOLN: INTRAVENOUS | Status: DC

## 2021-11-16 NOTE — Progress Notes (Signed)
Pt back in nursing recovery area. Pt still drowsy from procedure but will wake up when spoken to. Pt follows commands, talks in complete sentences and has no complaints at this time. Pt will remain in nursing station until discharge.  ?

## 2021-11-16 NOTE — Discharge Instructions (Signed)
Kyphoplasty Post Procedure Discharge Instructions  May resume a regular diet and any medications that you routinely take (including pain medications). However, if you are taking Aspirin or an anticoagulant/blood thinner you will be told when you can resume taking these by the healthcare provider. No driving day of procedure. The day of your procedure take it easy. You may use an ice pack as needed to injection sites on back.  Ice to back 30 minutes on and 30 minutes off, as needed. May remove bandaids tomorrow after taking a shower. Replace daily with a clean bandaid until healed.  Do not lift anything heavier than a milk jug for 1-2 weeks or determined by your physician.  Follow up with your physician in 2 weeks.    Please contact our office at [phone] for the following symptoms or if you have any questions:  Fever greater than 100 degrees Increased swelling, pain, or redness at injection site. Increased back and/or leg pain New numbness or change in symptoms from before the procedure.    Thank you for visiting [facility]. 

## 2021-11-16 NOTE — Procedures (Signed)
Pre procedural Dx: Symptomatic compression fracture Post procedural Dx: Same  Technically successful cement augmentation of the T5 and T6 vertebral bodies.  EBL: Minimal Complications: None immediate.  Ronny Bacon, MD Pager #: [phone]

## 2022-01-25 ENCOUNTER — Other Ambulatory Visit: Payer: Self-pay | Admitting: Family Medicine

## 2022-01-25 DIAGNOSIS — M5416 Radiculopathy, lumbar region: Secondary | ICD-10-CM

## 2022-01-26 ENCOUNTER — Ambulatory Visit: Admission: RE | Admit: 2022-01-26 | Payer: Medicare Other | Source: Ambulatory Visit

## 2022-01-26 ENCOUNTER — Other Ambulatory Visit: Payer: Self-pay | Admitting: Family Medicine

## 2022-01-26 ENCOUNTER — Ambulatory Visit: Payer: Medicare Other

## 2022-01-26 DIAGNOSIS — M5416 Radiculopathy, lumbar region: Secondary | ICD-10-CM

## 2022-01-26 DIAGNOSIS — S22000A Wedge compression fracture of unspecified thoracic vertebra, initial encounter for closed fracture: Secondary | ICD-10-CM

## 2022-01-28 ENCOUNTER — Ambulatory Visit
Admission: RE | Admit: 2022-01-28 | Discharge: 2022-01-28 | Disposition: A | Discharge status: Home / Self Care | Payer: Medicare Other | Source: Ambulatory Visit | Attending: Family Medicine | Admitting: Family Medicine

## 2022-01-28 DIAGNOSIS — S22000A Wedge compression fracture of unspecified thoracic vertebra, initial encounter for closed fracture: Secondary | ICD-10-CM | POA: Diagnosis present

## 2022-01-28 DIAGNOSIS — M5416 Radiculopathy, lumbar region: Secondary | ICD-10-CM

## 2022-01-31 ENCOUNTER — Encounter: Payer: Self-pay | Admitting: Family Medicine

## 2022-02-01 ENCOUNTER — Ambulatory Visit
Admission: RE | Admit: 2022-02-01 | Discharge: 2022-02-01 | Disposition: A | Discharge status: Home / Self Care | Payer: Medicare Other | Source: Ambulatory Visit | Attending: Family Medicine | Admitting: Family Medicine

## 2022-02-01 ENCOUNTER — Other Ambulatory Visit: Payer: Self-pay | Admitting: Family Medicine

## 2022-02-01 DIAGNOSIS — M8008XA Age-related osteoporosis with current pathological fracture, vertebra(e), initial encounter for fracture: Secondary | ICD-10-CM

## 2022-02-01 HISTORY — PX: IR RADIOLOGIST EVAL & MGMT: IMG5224

## 2022-02-01 NOTE — Progress Notes (Signed)
Chief Complaint: Patient was seen in consultation today for new thoracic spine pain at the request of Meeler,Whitney L  Referring Physician(s): Meeler,Whitney L  History of Present Illness: Kelly Wells is a 77 y.o. female With a history of osteoporosis and multiple thoracic and lumbar compression fractures presents with new onset lower thoracic spine pain.  She had upper thoracic spine pain due to osteoporotic compression fractures at T5 and T6 which were successfully treated by my partner on 11/16/2021.  The patient recovered well and was significantly improved until approximately 3 weeks ago when she had a fairly busy day taking down her Christmas tree, Christmas decorations and vacuuming the house.  Beginning the next morning she slowly developed new onset of severe lower thoracic pain.  Repeat MRI was performed 01/28/2022 which demonstrates a new acute compression fracture of the superior endplate of C78.  Her pain is quite severe.  She rates it a 10 out of 10 on a 10 point scale.  She is taking ibuprofen as well as narcotic pain medication which she says "does not touch the pain ".  Her pain is also disabling.  She scored 16 out of 24 on the [employer] disability questionnaire.  Past Medical History:  Diagnosis Date   Anemia    Anxiety    Arthritis    KNEES,HANDS   Baker's cyst of knee, right    Depression    Diabetes mellitus without complication ([place])    type 2   Headache    SINUS ISSUES   Hyperlipidemia    Hypertension    Hypothyroidism    Sinusitis    chronic, with nasal obstruction,tinnitis, and hearing loss   Sleep apnea    NO CPAP   Vertigo    4 day episode end of March 2023    Past Surgical History:  Procedure Laterality Date   ABDOMINAL HYSTERECTOMY     BREAST BIOPSY Bilateral 1980's   sugical bx    CARDIAC CATHETERIZATION     YEARS AGO   CATARACT EXTRACTION W/PHACO Right 04/17/2021   Procedure: CATARACT EXTRACTION PHACO AND INTRAOCULAR LENS  PLACEMENT (IOC) RIGHT DIABETIC 4.90 00:40.5;  Surgeon: Eulogio Bear, MD;  Location: [facility];  Service: Ophthalmology;  Laterality: Right;  Diabetic   CATARACT EXTRACTION W/PHACO Left 05/08/2021   Procedure: CATARACT EXTRACTION PHACO AND INTRAOCULAR LENS PLACEMENT (IOC) LEFT DIABETIC 3.24 00:28.5;  Surgeon: Eulogio Bear, MD;  Location: [facility];  Service: Ophthalmology;  Laterality: Left;  Diabetic   CHOLECYSTECTOMY     COLONOSCOPY WITH PROPOFOL N/A 07/04/2015   Procedure: COLONOSCOPY WITH PROPOFOL;  Surgeon: Manya Silvas, MD;  Location: [facility] ENDOSCOPY;  Service: Endoscopy;  Laterality: N/A;   ETHMOIDECTOMY Left 03/13/2017   Procedure: ANTERIOR ETHMOIDECTOMY WITH FRONTAL SINUSOTOMY;  Surgeon: Carloyn Manner, MD;  Location: [facility];  Service: ENT;  Laterality: Left;   IMAGE GUIDED SINUS SURGERY Left 03/13/2017   Procedure: IMAGE GUIDED SINUS SURGERY;  Surgeon: Carloyn Manner, MD;  Location: [facility];  Service: ENT;  Laterality: Left;  NEED STRYKER DISK DIABETIC/ SLEEP APNEA-no CPAP GAVE DISK TO CECE 2-28   IR KYPHO EA ADDL LEVEL THORACIC OR LUMBAR  11/16/2021   IR KYPHO THORACIC WITH BONE BIOPSY  11/16/2021   IR RADIOLOGIST EVAL & MGMT  11/07/2021   IR RADIOLOGIST EVAL & MGMT  02/01/2022   JOINT REPLACEMENT Left 2005   knee   MAXILLARY ANTROSTOMY Left 03/13/2017   Procedure: MAXILLARY ANTROSTOMY WITH TISSUE REMOVAL;  Surgeon: Carloyn Manner, MD;  Location: [facility];  Service: ENT;  Laterality: Left;    Allergies: Keflex [cephalexin] and Clindamycin/lincomycin  Medications: Prior to Admission medications   Medication Sig Start Date End Date Taking? Authorizing Provider  clonazePAM (KLONOPIN) 0.5 MG tablet Take 0.5 mg by mouth 2 (two) times daily as needed for anxiety.   Yes [provider]  gabapentin (NEURONTIN) 600 MG tablet Take 600 mg by mouth 3 (three) times daily. 600 mg AM, 600 mg lunchtime, 1200 mg PM    Yes [provider]  HYDROcodone-acetaminophen (NORCO) 7.5-325 MG tablet Take 1 tablet by mouth every 6 (six) hours as needed for moderate pain.   Yes [provider]  levothyroxine (SYNTHROID) 100 MCG tablet Take 100 mcg by mouth daily before breakfast.   Yes [provider]  meclizine (ANTIVERT) 25 MG tablet Take 25 mg by mouth 3 (three) times daily as needed for dizziness.   Yes [provider]  metFORMIN (GLUCOPHAGE) 1000 MG tablet Take 1,000 mg by mouth 2 (two) times daily with a meal.   Yes [provider]  metoprolol succinate (TOPROL-XL) 25 MG 24 hr tablet Take 25 mg by mouth daily.   Yes [provider]  pregabalin (LYRICA) 50 MG capsule Take 50 mg by mouth 4 (four) times daily as needed.   Yes [provider]  rOPINIRole (REQUIP) 0.5 MG tablet Take 0.5 mg by mouth at bedtime.   Yes [provider]  traMADol (ULTRAM) 50 MG tablet Take 50-100 mg by mouth every 6 (six) hours as needed.   Yes [provider]  baclofen (LIORESAL) 10 MG tablet Take 1 tablet (10 mg total) by mouth 3 (three) times daily as needed for muscle spasms. Patient not taking: Reported on 02/01/2022 10/17/21   Laurene Footman B, PA-C  empagliflozin (JARDIANCE) 10 MG TABS tablet Take 10 mg by mouth daily. Patient not taking: Reported on 02/01/2022    [provider]  FLUoxetine (PROZAC) 40 MG capsule Take 40 mg by mouth daily. AM Patient not taking: Reported on 02/01/2022    [provider]  lisinopril (ZESTRIL) 10 MG tablet Take 10 mg by mouth daily. Patient not taking: Reported on 02/01/2022    [provider]  loperamide (IMODIUM A-D) 2 MG tablet Take 2 mg by mouth 4 (four) times daily as needed for diarrhea or loose stools.    [provider]     Family History  Problem Relation Age of Onset   Stroke Mother    Breast cancer Neg Hx     Social History   Socioeconomic History   Marital status: Widowed     Spouse name: Not on file   Number of children: Not on file   Years of education: Not on file   Highest education level: Not on file  Occupational History   Not on file  Tobacco Use   Smoking status: Never   Smokeless tobacco: Never  Vaping Use   Vaping Use: Never used  Substance and Sexual Activity   Alcohol use: No   Drug use: No   Sexual activity: Not on file  Other Topics Concern   Not on file  Social History Narrative   Not on file   Social Determinants of Health   Financial Resource Strain: Not on file  Food Insecurity: Not on file  Transportation Needs: Not on file  Physical Activity: Not on file  Stress: Not on file  Social Connections: Not on file  Review of Systems: A 12 point ROS discussed and pertinent positives are indicated in the HPI above.  All other systems are negative.  Review of Systems  Vital Signs: BP (!) 142/96 (BP Location: Left Arm, Patient Position: Sitting, Cuff Size: Normal)   Pulse 77   Temp 98.3 F (36.8 C) (Oral)   Resp 14   SpO2 99%    Physical Exam Constitutional:      Appearance: Normal appearance.  HENT:     Head: Normocephalic and atraumatic.  Eyes:     General: No scleral icterus. Cardiovascular:     Rate and Rhythm: Normal rate.  Pulmonary:     Effort: Pulmonary effort is normal.  Abdominal:     General: Abdomen is flat. There is no distension.     Tenderness: There is no abdominal tenderness.  Musculoskeletal:       Back:     Comments: TTP at the T11 spinous process  Skin:    General: Skin is warm and dry.  Neurological:     Mental Status: She is alert and oriented to person, place, and time.  Psychiatric:        Mood and Affect: Mood normal.        Behavior: Behavior normal.      Imaging: IR Radiologist Eval & Mgmt  Result Date: 02/01/2022 EXAM: ESTABLISHED PATIENT OFFICE VISIT CHIEF COMPLAINT: SEE EPIC NOTE HISTORY OF PRESENT ILLNESS: SEE EPIC NOTE REVIEW OF SYSTEMS: SEE EPIC NOTE PHYSICAL  EXAMINATION: SEE EPIC NOTE ASSESSMENT AND PLAN: SEE EPIC NOTE Electronically Signed   By: Jacqulynn Cadet M.D.   On: 02/01/2022 16:35   MR LUMBAR SPINE WO CONTRAST  Addendum Date: 01/31/2022   ADDENDUM REPORT: 01/31/2022 08:15 ADDENDUM: One further review, there also is an acute T11 superior endplate fracture which involves bilateral pedicles with mild height loss. Associated marrow edema. Trace bony retropulsion without significant canal stenosis. This finding was discussed with Dr. Allene Dillon by Dr. Lennon Alstrom at approximately 1:40 p.m. via telephone Electronically Signed   By: Margaretha Sheffield M.D.   On: 01/31/2022 08:15   Result Date: 01/31/2022 CLINICAL DATA:  Back injury. EXAM: MRI THORACIC AND LUMBAR SPINE WITHOUT CONTRAST TECHNIQUE: Multiplanar and multiecho pulse sequences of the thoracic and lumbar spine were obtained without intravenous contrast. COMPARISON:  MRI thoracic spine October 30, 2020. MRI of the lumbar spine January 19, 21. FINDINGS: MRI THORACIC SPINE FINDINGS Alignment: Exaggerated thoracic kyphosis in the region of T5 and T6 fractures. No substantial sagittal subluxation of the thoracic spine. Vertebrae: Prior kyphoplasties of T5 and T6. In comparison to MRI from October 30, 2021, progressive height loss with approximately 50% height loss at T5 and T6. Marrow edema at both levels, milder at T5. No bony retropulsion. Cord:  Normal cord signal. Paraspinal and other soft tissues: Unremarkable. Disc levels: Left mild multilevel foraminal stenosis. No high-grade bony canal or foraminal stenosis in the thoracic spine. MRI LUMBAR SPINE FINDINGS Segmentation:  Standard. Alignment: Similar bony retropulsion of L1. Similar grade 1 anterolisthesis of L4 on L5. Vertebrae: Similar severe chronic L1 burst fracture with vertebral plana and approximately 9 mm of bony retropulsion. No associated bone marrow edema to suggest acute component. No marrow edema in the other lumbar vertebral bodies to  suggest acute fracture. Conus medullaris and cauda equina: Conus extends to the L1-L2 level. Normal conus signal. Paraspinal and other soft tissues: Unremarkable. Disc levels: T12-L1: Bony retropulsion superiorly along the L1 vertebral body results in similar moderate to severe L1  canal stenosis. L1-L2: Mild disc bulging.  No significant stenosis. L2-L3: Mild disc bulging. Ligamentum flavum thickening facet arthropathy. Mild bilateral foraminal stenosis. Mild canal and subarticular recess stenosis. L3-L4: Mild disc bulging, ligamentum flavum thickening and bilateral facet arthropathy. Mild canal and bilateral foraminal stenosis. L4-L5: Grade 1 anterolisthesis. Disc bulge with ligamentum flavum thickening and bilateral facet arthropathy. Similar moderate canal stenosis and mild bilateral foraminal stenosis. L5-S1: Mild disc bulging. Bilateral facet arthropathy. Similar mild bilateral foraminal stenosis. IMPRESSION: MR THORACIC SPINE IMPRESSION Prior kyphoplasties of T5 and T6. In comparison to MRI from October 30, 2021, progressive height loss with approximately 50% height loss at T5 and T6. Marrow edema at both levels suggesting recent or unhealed fractures, milder at T5. No bony retropulsion. MR LUMBAR SPINE IMPRESSION 1. 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 No associated bone marrow edema to suggest acute component 2. At L3-L4, similar moderate canal and mild bilateral foraminal stenosis. 3. Milder multilevel degenerative changes are detailed above. Electronically Signed: By: Margaretha Sheffield M.D. On: 01/28/2022 17:13   MR THORACIC SPINE WO CONTRAST  Addendum Date: 01/31/2022   ADDENDUM REPORT: 01/31/2022 08:15 ADDENDUM: One further review, there also is an acute T11 superior endplate fracture which involves bilateral pedicles with mild height loss. Associated marrow edema. Trace bony retropulsion  without significant canal stenosis. This finding was discussed with Dr. Allene Dillon by Dr. Lennon Alstrom at approximately 1:40 p.m. via telephone Electronically Signed   By: Margaretha Sheffield M.D.   On: 01/31/2022 08:15   Result Date: 01/31/2022 CLINICAL DATA:  Back injury. EXAM: MRI THORACIC AND LUMBAR SPINE WITHOUT CONTRAST TECHNIQUE: Multiplanar and multiecho pulse sequences of the thoracic and lumbar spine were obtained without intravenous contrast. COMPARISON:  MRI thoracic spine October 30, 2020. MRI of the lumbar spine January 19, 21. FINDINGS: MRI THORACIC SPINE FINDINGS Alignment: Exaggerated thoracic kyphosis in the region of T5 and T6 fractures. No substantial sagittal subluxation of the thoracic spine. Vertebrae: Prior kyphoplasties of T5 and T6. In comparison to MRI from October 30, 2021, progressive height loss with approximately 50% height loss at T5 and T6. Marrow edema at both levels, milder at T5. No bony retropulsion. Cord:  Normal cord signal. Paraspinal and other soft tissues: Unremarkable. Disc levels: Left mild multilevel foraminal stenosis. No high-grade bony canal or foraminal stenosis in the thoracic spine. MRI LUMBAR SPINE FINDINGS Segmentation:  Standard. Alignment: Similar bony retropulsion of L1. Similar grade 1 anterolisthesis of L4 on L5. Vertebrae: Similar severe chronic L1 burst fracture with vertebral plana and approximately 9 mm of bony retropulsion. No associated bone marrow edema to suggest acute component. No marrow edema in the other lumbar vertebral bodies to suggest acute fracture. Conus medullaris and cauda equina: Conus extends to the L1-L2 level. Normal conus signal. Paraspinal and other soft tissues: Unremarkable. Disc levels: T12-L1: Bony retropulsion superiorly along the L1 vertebral body results in similar moderate to severe L1 canal stenosis. L1-L2: Mild disc bulging.  No significant stenosis. L2-L3: Mild disc bulging. Ligamentum flavum thickening facet arthropathy.  Mild bilateral foraminal stenosis. Mild canal and subarticular recess stenosis. L3-L4: Mild disc bulging, ligamentum flavum thickening and bilateral facet arthropathy. Mild canal and bilateral foraminal stenosis. L4-L5: Grade 1 anterolisthesis. Disc bulge with ligamentum flavum thickening and bilateral facet arthropathy. Similar moderate canal stenosis and mild bilateral foraminal stenosis. L5-S1: Mild disc bulging. Bilateral facet arthropathy. Similar mild bilateral foraminal stenosis. IMPRESSION: MR THORACIC SPINE  IMPRESSION Prior kyphoplasties of T5 and T6. In comparison to MRI from October 30, 2021, progressive height loss with approximately 50% height loss at T5 and T6. Marrow edema at both levels suggesting recent or unhealed fractures, milder at T5. No bony retropulsion. MR LUMBAR SPINE IMPRESSION 1. 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 No associated bone marrow edema to suggest acute component 2. At L3-L4, similar moderate canal and mild bilateral foraminal stenosis. 3. Milder multilevel degenerative changes are detailed above. Electronically Signed: By: Margaretha Sheffield M.D. On: 01/28/2022 17:13    Labs:  CBC: No results for input(s): "WBC", "HGB", "HCT", "PLT" in the last 8760 hours.  COAGS: No results for input(s): "INR", "APTT" in the last 8760 hours.  BMP: No results for input(s): "NA", "K", "CL", "CO2", "GLUCOSE", "BUN", "CALCIUM", "CREATININE", "GFRNONAA", "GFRAA" in the last 8760 hours.  Invalid input(s): "CMP"  LIVER FUNCTION TESTS: No results for input(s): "BILITOT", "AST", "ALT", "ALKPHOS", "PROT", "ALBUMIN" in the last 8760 hours.  TUMOR MARKERS: No results for input(s): "AFPTM", "CEA", "CA199", "CHROMGRNA" in the last 8760 hours.  Assessment & Plan:   Patient has suffered acute osteoporotic fracture of the T11 vertebra.   History and exam have demonstrated the  following:  Acute/Subacute fracture by imaging dated 01/28/22, Pain on exam concordant with level of fracture, Failure of conservative therapy and pain refractory to narcotic pain mediation, and Significant disability on the [place] with 16/24 positive symptoms, reflecting significant impact/impairment of (ADLs)   ICD-10-CM Codes that Support Medical Necessity ([email or web address])  M80.08XA    Age-related osteoporosis with current pathological fracture, vertebra(e), initial encounter for fracture and S22.080A    Wedge compression fracture of T11-T12 vertebra, initial encounter for closed fracture    Plan:  T11 vertebral body augmentation with balloon kyphoplasty  Post-procedure disposition: outpatient [facility]  Medication holds: None  The patient has suffered a fracture of the T11 vertebral body. It is recommended that patients aged 73 years or older be evaluated for possible testing or treatment of osteoporosis. A copy of this consult report is sent to the patient's referring physician.  Advanced Care Plan: The patient did not want to provide an [place] at the time of this visit     Total time spent on today's visit was over  15 Minutes, including both face-to-face time and non face-to-face time, personally spent on review of chart (including labs and relevant imaging), discussing further workup and treatment options, referral to specialist if needed, reviewing outside records if pertinent, answering patient questions, and coordinating care regarding new T11 compression fracture as well as management strategy.   Electronically Signed: Criselda Peaches 02/01/2022, 4:48 PM

## 2022-02-08 ENCOUNTER — Other Ambulatory Visit: Payer: Medicare Other

## 2022-02-08 ENCOUNTER — Ambulatory Visit
Admission: RE | Admit: 2022-02-08 | Discharge: 2022-02-08 | Disposition: A | Discharge status: Home / Self Care | Payer: Medicare Other | Source: Ambulatory Visit | Attending: Family Medicine | Admitting: Family Medicine

## 2022-02-08 DIAGNOSIS — M8008XA Age-related osteoporosis with current pathological fracture, vertebra(e), initial encounter for fracture: Secondary | ICD-10-CM

## 2022-02-08 HISTORY — PX: IR KYPHO LUMBAR INC FX REDUCE BONE BX UNI/BIL CANNULATION INC/IMAGING: IMG5519

## 2022-02-08 MED ORDER — ACETAMINOPHEN 10 MG/ML IV SOLN
1000.0000 mg | Freq: Once | INTRAVENOUS | Status: AC
  Administered 2022-02-08: 1000 mg via INTRAVENOUS

## 2022-02-08 MED ORDER — FENTANYL CITRATE PF 50 MCG/ML IJ SOSY
25.0000 ug | PREFILLED_SYRINGE | INTRAMUSCULAR | Status: DC | PRN
  Administered 2022-02-08 (×3): 25 ug via INTRAVENOUS
  Administered 2022-02-08: 50 ug via INTRAVENOUS
  Administered 2022-02-08: 25 ug via INTRAVENOUS

## 2022-02-08 MED ORDER — MIDAZOLAM HCL 2 MG/2ML IJ SOLN
1.0000 mg | INTRAMUSCULAR | Status: DC | PRN
  Administered 2022-02-08 (×4): 1 mg via INTRAVENOUS

## 2022-02-08 MED ORDER — VANCOMYCIN HCL IN DEXTROSE 1-5 GM/200ML-% IV SOLN
1000.0000 mg | INTRAVENOUS | Status: AC
  Administered 2022-02-08: 1000 mg via INTRAVENOUS

## 2022-02-08 MED ORDER — SODIUM CHLORIDE 0.9 % IV SOLN: INTRAVENOUS | Status: DC

## 2022-02-08 NOTE — Discharge Instructions (Signed)
Kyphoplasty Post Procedure Discharge Instructions  May resume a regular diet and any medications that you routinely take (including pain medications). However, if you are taking Aspirin or an anticoagulant/blood thinner you will be told when you can resume taking these by the healthcare provider. No driving day of procedure. The day of your procedure take it easy. You may use an ice pack as needed to injection sites on back.  Ice to back 30 minutes on and 30 minutes off, as needed. May remove bandaids tomorrow after taking a shower. Replace daily with a clean bandaid until healed.  Do not lift anything heavier than a milk jug for 1-2 weeks or determined by your physician.  Follow up with your physician in 2 weeks.    Please contact our office at [phone] for the following symptoms or if you have any questions:  Fever greater than 100 degrees Increased swelling, pain, or redness at injection site. Increased back and/or leg pain New numbness or change in symptoms from before the procedure.    Thank you for visiting [facility]. 

## 2022-02-08 NOTE — Progress Notes (Signed)
Pt back in nursing recovery area. Pt still drowsy from procedure but will wake up when spoken to. Pt follows commands, talks in complete sentences and has no complaints at this time. Pt will remain in nursing station until discharge.  ?

## 2022-02-15 ENCOUNTER — Encounter: Admission: RE | Payer: Self-pay | Source: Home / Self Care

## 2022-02-15 ENCOUNTER — Ambulatory Visit: Admission: RE | Admit: 2022-02-15 | Payer: Medicare Other | Source: Home / Self Care | Admitting: Gastroenterology

## 2022-02-15 SURGERY — COLONOSCOPY WITH PROPOFOL
Anesthesia: General

## 2022-02-19 ENCOUNTER — Other Ambulatory Visit: Payer: Self-pay | Admitting: Interventional Radiology

## 2022-02-19 ENCOUNTER — Telehealth: Payer: Self-pay

## 2022-02-19 DIAGNOSIS — S22080S Wedge compression fracture of T11-T12 vertebra, sequela: Secondary | ICD-10-CM

## 2022-02-19 NOTE — Telephone Encounter (Signed)
Phone call to pt to follow up from her kyphoplasty on 02/08/22. Pt reports her pain is much better post procedure but is still having "a little soreness". Pt reports she is able to move around a little better. Pt denies any signs of infection, redness at the site, draining or fever. Pt has no complaints at this time and will be scheduled for a telephone follow up with Dr. Laurence Ferrari next week. Pt advised to call back if anything were to change or any concerns arise and we will arrange an in person appointment. Pt verbalized understanding.

## 2022-02-27 ENCOUNTER — Ambulatory Visit
Admission: RE | Admit: 2022-02-27 | Discharge: 2022-02-27 | Disposition: A | Discharge status: Home / Self Care | Payer: Medicare Other | Source: Ambulatory Visit | Attending: Interventional Radiology | Admitting: Interventional Radiology

## 2022-02-27 DIAGNOSIS — S22080S Wedge compression fracture of T11-T12 vertebra, sequela: Secondary | ICD-10-CM

## 2022-02-27 HISTORY — PX: IR RADIOLOGIST EVAL & MGMT: IMG5224

## 2022-02-27 NOTE — Progress Notes (Signed)
Chief Complaint: Patient was consulted remotely today (TeleHealth) for No chief complaint on file.  at the request of Gladyse Corvin K.    Referring Physician(s): Evalyn Shultis K  History of Present Illness: Kelly Wells is a 77 y.o. female With a history of osteoporosis and multiple thoracic and lumbar compression fractures presents with new onset lower thoracic spine pain. She had upper thoracic spine pain due to osteoporotic compression fractures at T5 and T6 which were successfully treated by my partner on 11/16/2021. The patient recovered well and was significantly improved until approximately she had a fairly busy day taking down her Christmas tree, Christmas decorations and vacuuming the house. Beginning the next morning she slowly developed new onset of severe lower thoracic pain. Repeat MRI was performed 01/28/2022 which demonstrates a new acute compression fracture of the superior endplate of 624THL.   She underwent a second cement augmentation with balloon kyphoplasty of the T11 vertebral body which was performed by my partner, Dr. Dwaine Gale, on 02/08/2022.  We spoke over the telephone today in follow-up.  Kelly Wells returned to work today.  She notes that she was quite exhausted by the end of the day.  Her back is better, but not quite as pain-free as she had hoped.  She estimates that she has about a 50% reduction in her overall symptoms.  She has no new pain or worsening symptoms.  Past Medical History:  Diagnosis Date   Anemia    Anxiety    Arthritis    KNEES,HANDS   Baker's cyst of knee, right    Depression    Diabetes mellitus without complication ([place])    type 2   Headache    SINUS ISSUES   Hyperlipidemia    Hypertension    Hypothyroidism    Sinusitis    chronic, with nasal obstruction,tinnitis, and hearing loss   Sleep apnea    NO CPAP   Vertigo    4 day episode end of March 2023    Past Surgical History:  Procedure Laterality Date   ABDOMINAL HYSTERECTOMY      BREAST BIOPSY Bilateral 1980's   sugical bx    CARDIAC CATHETERIZATION     YEARS AGO   CATARACT EXTRACTION W/PHACO Right 04/17/2021   Procedure: CATARACT EXTRACTION PHACO AND INTRAOCULAR LENS PLACEMENT (IOC) RIGHT DIABETIC 4.90 00:40.5;  Surgeon: Eulogio Bear, MD;  Location: [facility];  Service: Ophthalmology;  Laterality: Right;  Diabetic   CATARACT EXTRACTION W/PHACO Left 05/08/2021   Procedure: CATARACT EXTRACTION PHACO AND INTRAOCULAR LENS PLACEMENT (IOC) LEFT DIABETIC 3.24 00:28.5;  Surgeon: Eulogio Bear, MD;  Location: [facility];  Service: Ophthalmology;  Laterality: Left;  Diabetic   CHOLECYSTECTOMY     COLONOSCOPY WITH PROPOFOL N/A 07/04/2015   Procedure: COLONOSCOPY WITH PROPOFOL;  Surgeon: Manya Silvas, MD;  Location: [facility] ENDOSCOPY;  Service: Endoscopy;  Laterality: N/A;   ETHMOIDECTOMY Left 03/13/2017   Procedure: ANTERIOR ETHMOIDECTOMY WITH FRONTAL SINUSOTOMY;  Surgeon: Carloyn Manner, MD;  Location: [facility];  Service: ENT;  Laterality: Left;   IMAGE GUIDED SINUS SURGERY Left 03/13/2017   Procedure: IMAGE GUIDED SINUS SURGERY;  Surgeon: Carloyn Manner, MD;  Location: [facility];  Service: ENT;  Laterality: Left;  NEED STRYKER DISK DIABETIC/ SLEEP APNEA-no CPAP GAVE DISK TO CECE 2-28   IR KYPHO EA ADDL LEVEL THORACIC OR LUMBAR  11/16/2021   IR KYPHO LUMBAR INC FX REDUCE BONE BX UNI/BIL CANNULATION INC/IMAGING  02/08/2022   IR KYPHO THORACIC WITH  BONE BIOPSY  11/16/2021   IR RADIOLOGIST EVAL & MGMT  11/07/2021   IR RADIOLOGIST EVAL & MGMT  02/01/2022   IR RADIOLOGIST EVAL & MGMT  02/27/2022   JOINT REPLACEMENT Left 2005   knee   MAXILLARY ANTROSTOMY Left 03/13/2017   Procedure: MAXILLARY ANTROSTOMY WITH TISSUE REMOVAL;  Surgeon: Carloyn Manner, MD;  Location: [facility];  Service: ENT;  Laterality: Left;    Allergies: Keflex [cephalexin] and Clindamycin/lincomycin  Medications: Prior to Admission medications    Medication Sig Start Date End Date Taking? Authorizing Provider  baclofen (LIORESAL) 10 MG tablet Take 1 tablet (10 mg total) by mouth 3 (three) times daily as needed for muscle spasms. Patient not taking: Reported on 02/01/2022 10/17/21   Danton Clap, PA-C  clonazePAM (KLONOPIN) 0.5 MG tablet Take 0.5 mg by mouth 2 (two) times daily as needed for anxiety.    [provider]  empagliflozin (JARDIANCE) 10 MG TABS tablet Take 10 mg by mouth daily. Patient not taking: Reported on 02/01/2022    [provider]  FLUoxetine (PROZAC) 40 MG capsule Take 40 mg by mouth daily. AM Patient not taking: Reported on 02/01/2022    [provider]  gabapentin (NEURONTIN) 600 MG tablet Take 600 mg by mouth 3 (three) times daily. 600 mg AM, 600 mg lunchtime, 1200 mg PM    [provider]  HYDROcodone-acetaminophen (NORCO) 7.5-325 MG tablet Take 1 tablet by mouth every 6 (six) hours as needed for moderate pain.    [provider]  levothyroxine (SYNTHROID) 100 MCG tablet Take 100 mcg by mouth daily before breakfast.    [provider]  lisinopril (ZESTRIL) 10 MG tablet Take 10 mg by mouth daily. Patient not taking: Reported on 02/01/2022    [provider]  loperamide (IMODIUM A-D) 2 MG tablet Take 2 mg by mouth 4 (four) times daily as needed for diarrhea or loose stools.    [provider]  meclizine (ANTIVERT) 25 MG tablet Take 25 mg by mouth 3 (three) times daily as needed for dizziness.    [provider]  metFORMIN (GLUCOPHAGE) 1000 MG tablet Take 1,000 mg by mouth 2 (two) times daily with a meal.    [provider]  metoprolol succinate (TOPROL-XL) 25 MG 24 hr tablet Take 25 mg by mouth daily.    [provider]  pregabalin (LYRICA) 50 MG capsule Take 50 mg by mouth 4 (four) times daily as needed.    [provider]  rOPINIRole (REQUIP) 0.5 MG tablet Take 0.5 mg by mouth at bedtime.    [provider]  traMADol (ULTRAM) 50 MG tablet Take 50-100 mg by mouth every 6 (six) hours as needed.    [provider]     Family History  Problem Relation Age of Onset   Stroke Mother    Breast cancer Neg Hx     Social History   Socioeconomic History   Marital status: Widowed    Spouse name: Not on file   Number of children: Not on file   Years of education: Not on file   Highest education level: Not on file  Occupational History   Not on file  Tobacco Use   Smoking status: Never   Smokeless tobacco: Never  Vaping Use   Vaping Use: Never used  Substance and Sexual Activity   Alcohol use: No   Drug use: No   Sexual activity: Not on file  Other Topics Concern  Not on file  Social History Narrative   Not on file   Social Determinants of Health   Financial Resource Strain: Not on file  Food Insecurity: Not on file  Transportation Needs: Not on file  Physical Activity: Not on file  Stress: Not on file  Social Connections: Not on file    Review of Systems  Review of Systems: A 12 point ROS discussed and pertinent positives are indicated in the HPI above.  All other systems are negative.   Physical Exam No direct physical exam was performed (except for noted visual exam findings with Video Visits).    Vital Signs: There were no vitals taken for this visit.  Imaging: IR Radiologist Eval & Mgmt  Result Date: 02/27/2022 EXAM: ESTABLISHED PATIENT OFFICE VISIT CHIEF COMPLAINT: SEE EPIC NOTE HISTORY OF PRESENT ILLNESS: SEE EPIC NOTE REVIEW OF SYSTEMS: SEE EPIC NOTE PHYSICAL EXAMINATION: SEE EPIC NOTE ASSESSMENT AND PLAN: SEE EPIC NOTE Electronically Signed   By: Jacqulynn Cadet M.D.   On: 02/27/2022 16:36   IR KYPHO LUMBAR INC FX REDUCE BONE BX UNI/BIL CANNULATION INC/IMAGING  Result Date: 02/08/2022 INDICATION: 77 year old woman with intractable back pain due to osteoporotic T11 compression fracture returns to IR for kyphoplasty EXAM: T11 kyphoplasty  COMPARISON:  None Available. MEDICATIONS: As antibiotic prophylaxis, vancomycin 1 g IV was ordered pre-procedure and administered intravenously within 1 hour of incision. All current medications are in the EMR and have been reviewed as part of this encounter. ANESTHESIA/SEDATION: Moderate (conscious) sedation was employed during this procedure. A total of Versed 4 mg and Fentanyl 115 mcg was administered intravenously by the radiology nurse. Total intra-service moderate Sedation Time: 34 minutes. The patient's level of consciousness and vital signs were monitored continuously by radiology nursing throughout the procedure under my direct supervision. FLUOROSCOPY: Radiation Exposure Index (as provided by the fluoroscopic device): [id number] mGy Kerma COMPLICATIONS: None immediate. PROCEDURE: Following a full explanation of the procedure along with the potential associated complications, an informed witnessed consent was obtained. Patient positioned prone on the angiography table. Mid back skin prepped and draped in usual sterile fashion. All elements of maximal sterile barrier were utilized including, cap, mask, sterile gown, sterile gloves, large sterile drape, hand scrubbing and 2% Chlorhexidine for skin cleaning. The T11 vertebral body was localized under fluoroscopy. Intermittent fluoroscopy was used for guidance throughout the procedure. Multiple spot films of the lower thoracic spine were also obtained in the AP and lateral projections during the procedure. Right transpedicular cannulation of the vertebral body was performed with 10 ga Osteo-introducer needle. Fluoroscopy showed satisfactory positioning of equipment within the mildly compressed vertebral body with no displaced fracture fragments. Partial fracture reduction and bony cavity creation was then performed using an inflatable bone tamp. The bone tamp was removed, and vertebral augmentation was performed to careful application of 3 mL of firm consistency bone  cement under lateral fluoroscopic guidance. Given symmetric vertebral body fill, contralateral transpedicular approach was not performed. The cannula was removed and hemostasis achieved with manual compression. IMPRESSION: T11 kyphoplasty If the patient has known osteoporosis, recommend treatment as clinically indicated. If the patient's bone density status is unknown, DEXA scan is recommended. Electronically Signed   By: Miachel Roux M.D.   On: 02/08/2022 15:47   IR Radiologist Eval & Mgmt  Result Date: 02/01/2022 EXAM: ESTABLISHED PATIENT OFFICE VISIT CHIEF COMPLAINT: SEE EPIC NOTE HISTORY OF PRESENT ILLNESS: SEE EPIC NOTE REVIEW OF SYSTEMS: SEE EPIC NOTE PHYSICAL EXAMINATION: SEE EPIC NOTE ASSESSMENT AND PLAN: SEE  EPIC NOTE Electronically Signed   By: Jacqulynn Cadet M.D.   On: 02/01/2022 16:35   MR LUMBAR SPINE WO CONTRAST  Addendum Date: 01/31/2022   ADDENDUM REPORT: 01/31/2022 08:15 ADDENDUM: One further review, there also is an acute T11 superior endplate fracture which involves bilateral pedicles with mild height loss. Associated marrow edema. Trace bony retropulsion without significant canal stenosis. This finding was discussed with Dr. Allene Dillon by Dr. Lennon Alstrom at approximately 1:40 p.m. via telephone Electronically Signed   By: Margaretha Sheffield M.D.   On: 01/31/2022 08:15   Result Date: 01/31/2022 CLINICAL DATA:  Back injury. EXAM: MRI THORACIC AND LUMBAR SPINE WITHOUT CONTRAST TECHNIQUE: Multiplanar and multiecho pulse sequences of the thoracic and lumbar spine were obtained without intravenous contrast. COMPARISON:  MRI thoracic spine October 30, 2020. MRI of the lumbar spine January 19, 21. FINDINGS: MRI THORACIC SPINE FINDINGS Alignment: Exaggerated thoracic kyphosis in the region of T5 and T6 fractures. No substantial sagittal subluxation of the thoracic spine. Vertebrae: Prior kyphoplasties of T5 and T6. In comparison to MRI from October 30, 2021, progressive height loss with  approximately 50% height loss at T5 and T6. Marrow edema at both levels, milder at T5. No bony retropulsion. Cord:  Normal cord signal. Paraspinal and other soft tissues: Unremarkable. Disc levels: Left mild multilevel foraminal stenosis. No high-grade bony canal or foraminal stenosis in the thoracic spine. MRI LUMBAR SPINE FINDINGS Segmentation:  Standard. Alignment: Similar bony retropulsion of L1. Similar grade 1 anterolisthesis of L4 on L5. Vertebrae: Similar severe chronic L1 burst fracture with vertebral plana and approximately 9 mm of bony retropulsion. No associated bone marrow edema to suggest acute component. No marrow edema in the other lumbar vertebral bodies to suggest acute fracture. Conus medullaris and cauda equina: Conus extends to the L1-L2 level. Normal conus signal. Paraspinal and other soft tissues: Unremarkable. Disc levels: T12-L1: Bony retropulsion superiorly along the L1 vertebral body results in similar moderate to severe L1 canal stenosis. L1-L2: Mild disc bulging.  No significant stenosis. L2-L3: Mild disc bulging. Ligamentum flavum thickening facet arthropathy. Mild bilateral foraminal stenosis. Mild canal and subarticular recess stenosis. L3-L4: Mild disc bulging, ligamentum flavum thickening and bilateral facet arthropathy. Mild canal and bilateral foraminal stenosis. L4-L5: Grade 1 anterolisthesis. Disc bulge with ligamentum flavum thickening and bilateral facet arthropathy. Similar moderate canal stenosis and mild bilateral foraminal stenosis. L5-S1: Mild disc bulging. Bilateral facet arthropathy. Similar mild bilateral foraminal stenosis. IMPRESSION: MR THORACIC SPINE IMPRESSION Prior kyphoplasties of T5 and T6. In comparison to MRI from October 30, 2021, progressive height loss with approximately 50% height loss at T5 and T6. Marrow edema at both levels suggesting recent or unhealed fractures, milder at T5. No bony retropulsion. MR LUMBAR SPINE IMPRESSION 1. Similar severe chronic  L1 burst fracture with vertebral plana and approximately 9 mm of bony retropulsion and moderate to severe canal stenosis. No associated bone marrow edema to suggest acute component 2. At L3-L4, similar moderate canal and mild bilateral foraminal stenosis. 3. Milder multilevel degenerative changes are detailed above. Electronically Signed: By: Margaretha Sheffield M.D. On: 01/28/2022 17:13   MR THORACIC SPINE WO CONTRAST  Addendum Date: 01/31/2022   ADDENDUM REPORT: 01/31/2022 08:15 ADDENDUM: One further review, there also is an acute T11 superior endplate fracture which involves bilateral pedicles with mild height loss. Associated marrow edema. Trace bony retropulsion without significant canal stenosis. This finding was discussed with Dr. Allene Dillon by Dr. Lennon Alstrom at approximately 1:40 p.m. via telephone Electronically Signed  By: Margaretha Sheffield M.D.   On: 01/31/2022 08:15   Result Date: 01/31/2022 CLINICAL DATA:  Back injury. EXAM: MRI THORACIC AND LUMBAR SPINE WITHOUT CONTRAST TECHNIQUE: Multiplanar and multiecho pulse sequences of the thoracic and lumbar spine were obtained without intravenous contrast. COMPARISON:  MRI thoracic spine October 30, 2020. MRI of the lumbar spine January 19, 21. FINDINGS: MRI THORACIC SPINE FINDINGS Alignment: Exaggerated thoracic kyphosis in the region of T5 and T6 fractures. No substantial sagittal subluxation of the thoracic spine. Vertebrae: Prior kyphoplasties of T5 and T6. In comparison to MRI from October 30, 2021, progressive height loss with approximately 50% height loss at T5 and T6. Marrow edema at both levels, milder at T5. No bony retropulsion. Cord:  Normal cord signal. Paraspinal and other soft tissues: Unremarkable. Disc levels: Left mild multilevel foraminal stenosis. No high-grade bony canal or foraminal stenosis in the thoracic spine. MRI LUMBAR SPINE FINDINGS Segmentation:  Standard. Alignment: Similar bony retropulsion of L1. Similar grade 1  anterolisthesis of L4 on L5. Vertebrae: Similar severe chronic L1 burst fracture with vertebral plana and approximately 9 mm of bony retropulsion. No associated bone marrow edema to suggest acute component. No marrow edema in the other lumbar vertebral bodies to suggest acute fracture. Conus medullaris and cauda equina: Conus extends to the L1-L2 level. Normal conus signal. Paraspinal and other soft tissues: Unremarkable. Disc levels: T12-L1: Bony retropulsion superiorly along the L1 vertebral body results in similar moderate to severe L1 canal stenosis. L1-L2: Mild disc bulging.  No significant stenosis. L2-L3: Mild disc bulging. Ligamentum flavum thickening facet arthropathy. Mild bilateral foraminal stenosis. Mild canal and subarticular recess stenosis. L3-L4: Mild disc bulging, ligamentum flavum thickening and bilateral facet arthropathy. Mild canal and bilateral foraminal stenosis. L4-L5: Grade 1 anterolisthesis. Disc bulge with ligamentum flavum thickening and bilateral facet arthropathy. Similar moderate canal stenosis and mild bilateral foraminal stenosis. L5-S1: Mild disc bulging. Bilateral facet arthropathy. Similar mild bilateral foraminal stenosis. IMPRESSION: MR THORACIC SPINE IMPRESSION Prior kyphoplasties of T5 and T6. In comparison to MRI from October 30, 2021, progressive height loss with approximately 50% height loss at T5 and T6. Marrow edema at both levels suggesting recent or unhealed fractures, milder at T5. No bony retropulsion. MR LUMBAR SPINE IMPRESSION 1. 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 No associated bone marrow edema to suggest acute component 2. At L3-L4, similar moderate canal and mild bilateral foraminal stenosis. 3. Milder multilevel degenerative changes are detailed above. Electronically Signed: By: Margaretha Sheffield M.D. On: 01/28/2022 17:13    Labs:  CBC: No results  for input(s): "WBC", "HGB", "HCT", "PLT" in the last 8760 hours.  COAGS: No results for input(s): "INR", "APTT" in the last 8760 hours.  BMP: No results for input(s): "NA", "K", "CL", "CO2", "GLUCOSE", "BUN", "CALCIUM", "CREATININE", "GFRNONAA", "GFRAA" in the last 8760 hours.  Invalid input(s): "CMP"  LIVER FUNCTION TESTS: No results for input(s): "BILITOT", "AST", "ALT", "ALKPHOS", "PROT", "ALBUMIN" in the last 8760 hours.  TUMOR MARKERS: No results for input(s): "AFPTM", "CEA", "CA199", "CHROMGRNA" in the last 8760 hours.  Assessment and Plan:  Very pleasant 77 year old female now 3 weeks status post cement augmentation with balloon kyphoplasty of her T11 osteoporotic compression fracture.  She has significant improvement (approximately 50% reduction in pain).  Unfortunately, this is not quite as dramatic of a result as she had on her first kyphoplasty but she is still pleased that she is feeling  somewhat better.  No further scheduled follow-up at this time.    Electronically Signed: Criselda Peaches 02/27/2022, 4:42 PM   I spent a total of  10 Minutes in remote  clinical consultation, greater than 50% of which was counseling/coordinating care for T11 fracture s/p KP.    Visit type: Audio only (telephone). Audio (no video) only due to patient preference. Alternative for in-person coAudio only (telephone). Audio (no video) only due to patient preference.nsultation at [facility], [address] [place], [place], [place]. This visit type was conducted due to national recommendations for restrictions regarding the COVID-19 Pandemic (e.g. social distancing).  This format is felt to be most appropriate for this patient at this time.  All issues noted in this document were discussed and addressed.

## 2022-06-27 ENCOUNTER — Other Ambulatory Visit: Payer: Self-pay | Admitting: Physician Assistant

## 2022-06-27 DIAGNOSIS — M5412 Radiculopathy, cervical region: Secondary | ICD-10-CM

## 2022-07-04 ENCOUNTER — Ambulatory Visit: Payer: Medicare Other

## 2022-07-18 ENCOUNTER — Ambulatory Visit
Admission: RE | Admit: 2022-07-18 | Discharge: 2022-07-18 | Disposition: A | Discharge status: Home / Self Care | Payer: Medicare Other | Source: Ambulatory Visit | Attending: Physician Assistant | Admitting: Physician Assistant

## 2022-07-18 DIAGNOSIS — M5412 Radiculopathy, cervical region: Secondary | ICD-10-CM | POA: Diagnosis present

## 2022-07-27 ENCOUNTER — Other Ambulatory Visit: Payer: Self-pay | Admitting: Infectious Diseases

## 2022-07-27 DIAGNOSIS — Z1231 Encounter for screening mammogram for malignant neoplasm of breast: Secondary | ICD-10-CM

## 2022-08-15 ENCOUNTER — Other Ambulatory Visit: Payer: Self-pay | Admitting: Family Medicine

## 2022-08-15 DIAGNOSIS — M5412 Radiculopathy, cervical region: Secondary | ICD-10-CM

## 2022-08-22 NOTE — Progress Notes (Unsigned)
Referring Physician:  Mick Sell, MD [address] [place],  [place] [address]  Primary Physician:  Mick Sell, MD  History of Present Illness: 08/23/2022 [name] is here today with a chief complaint of numbness in her arms particular when she drives.  She gets numbness from her elbows to her hands.  This began approximately 3 months ago.  She has no pain down her arms.  She has minimal neck pain.  Resting and relaxing her arms makes it better.  This occurs intermittently and is not currently occurring.   Bowel/Bladder Dysfunction: none  Conservative measures:  Physical therapy: No  Multimodal medical therapy including regular antiinflammatories: Gabapentin, Lyrica, Advil, Tylenol Injections: no epidural steroid injections  Past Surgery: none - multiple kyphoplasties  Kelly Wells has mild symptoms of cervical myelopathy.  The symptoms are causing a significant impact on the patient's life.   I have utilized the care everywhere function in epic to review the outside records available from external health systems.  Review of Systems:  A 10 point review of systems is negative, except for the pertinent positives and negatives detailed in the HPI.  Past Medical History: Past Medical History:  Diagnosis Date   Anemia    Anxiety    Arthritis    KNEES,HANDS   Baker's cyst of knee, right    Depression    Diabetes mellitus without complication (HCC)    type 2   Headache    SINUS ISSUES   Hyperlipidemia    Hypertension    Hypothyroidism    Sinusitis    chronic, with nasal obstruction,tinnitis, and hearing loss   Sleep apnea    NO CPAP   Vertigo    4 day episode end of March 2023    Past Surgical History: Past Surgical History:  Procedure Laterality Date   ABDOMINAL HYSTERECTOMY     BREAST BIOPSY Bilateral 1980's   sugical bx    CARDIAC CATHETERIZATION     YEARS AGO   CATARACT EXTRACTION W/PHACO Right 04/17/2021   Procedure:  CATARACT EXTRACTION PHACO AND INTRAOCULAR LENS PLACEMENT (IOC) RIGHT DIABETIC 4.90 00:40.5;  Surgeon: Nevada Crane, MD;  Location: [facility];  Service: Ophthalmology;  Laterality: Right;  Diabetic   CATARACT EXTRACTION W/PHACO Left 05/08/2021   Procedure: CATARACT EXTRACTION PHACO AND INTRAOCULAR LENS PLACEMENT (IOC) LEFT DIABETIC 3.24 00:28.5;  Surgeon: Nevada Crane, MD;  Location: [facility];  Service: Ophthalmology;  Laterality: Left;  Diabetic   CHOLECYSTECTOMY     COLONOSCOPY WITH PROPOFOL N/A 07/04/2015   Procedure: COLONOSCOPY WITH PROPOFOL;  Surgeon: Scot Jun, MD;  Location: [facility] ENDOSCOPY;  Service: Endoscopy;  Laterality: N/A;   ETHMOIDECTOMY Left 03/13/2017   Procedure: ANTERIOR ETHMOIDECTOMY WITH FRONTAL SINUSOTOMY;  Surgeon: Bud Face, MD;  Location: [facility];  Service: ENT;  Laterality: Left;   IMAGE GUIDED SINUS SURGERY Left 03/13/2017   Procedure: IMAGE GUIDED SINUS SURGERY;  Surgeon: Bud Face, MD;  Location: [facility];  Service: ENT;  Laterality: Left;  NEED STRYKER DISK DIABETIC/ SLEEP APNEA-no CPAP GAVE DISK TO CECE 2-28   IR KYPHO EA ADDL LEVEL THORACIC OR LUMBAR  11/16/2021   IR KYPHO LUMBAR INC FX REDUCE BONE BX UNI/BIL CANNULATION INC/IMAGING  02/08/2022   IR KYPHO THORACIC WITH BONE BIOPSY  11/16/2021   IR RADIOLOGIST EVAL & MGMT  11/07/2021   IR RADIOLOGIST EVAL & MGMT  02/01/2022   IR RADIOLOGIST EVAL & MGMT  02/27/2022  JOINT REPLACEMENT Left 2005   knee   MAXILLARY ANTROSTOMY Left 03/13/2017   Procedure: MAXILLARY ANTROSTOMY WITH TISSUE REMOVAL;  Surgeon: Bud Face, MD;  Location: [facility];  Service: ENT;  Laterality: Left;    Allergies: Allergies as of 08/23/2022 - Review Complete 08/23/2022  Allergen Reaction Noted   Dapagliflozin Other (See Comments) 05/28/2022   Keflex [cephalexin] Itching 07/01/2015   Clindamycin/lincomycin Rash 03/06/2017    Medications:  Current  Outpatient Medications:    acetaminophen (TYLENOL) 650 MG CR tablet, Take 650 mg by mouth every 8 (eight) hours as needed., Disp: , Rfl:    alendronate (FOSAMAX) 70 MG tablet, Take 70 mg by mouth once a week. Take with a full glass of water on an empty stomach., Disp: , Rfl:    clonazePAM (KLONOPIN) 0.5 MG tablet, Take 0.5 mg by mouth 2 (two) times daily as needed for anxiety., Disp: , Rfl:    DULoxetine (CYMBALTA) 20 MG capsule, Take 40 mg by mouth daily., Disp: , Rfl:    Exenatide ER (BYDUREON BCISE) 2 MG/0.85ML AUIJ, Inject 2 mLs into the skin once a week., Disp: , Rfl:    gabapentin (NEURONTIN) 600 MG tablet, Take 600 mg by mouth 3 (three) times daily. 600 mg AM, 600 mg lunchtime, 1200 mg PM, Disp: , Rfl:    ibuprofen (ADVIL) 600 MG tablet, Take 600 mg by mouth as needed., Disp: , Rfl:    Insulin Pen Needle (ULTIGUARD SAFEPACK PEN NEEDLE) 32G X 4 MM MISC, Frequency:BID   Dosage:0.0     Instructions:  Note:Dose: 1, Disp: , Rfl:    levothyroxine (SYNTHROID) 100 MCG tablet, Take 100 mcg by mouth daily before breakfast., Disp: , Rfl:    lisinopril (ZESTRIL) 10 MG tablet, Take 10 mg by mouth daily., Disp: , Rfl:    meclizine (ANTIVERT) 25 MG tablet, Take 25 mg by mouth 3 (three) times daily as needed for dizziness., Disp: , Rfl:    metFORMIN (GLUCOPHAGE) 1000 MG tablet, Take 1,000 mg by mouth 2 (two) times daily with a meal., Disp: , Rfl:    metoprolol succinate (TOPROL-XL) 25 MG 24 hr tablet, Take 25 mg by mouth daily., Disp: , Rfl:    pramipexole (MIRAPEX) 0.25 MG tablet, Take 0.25 mg by mouth daily., Disp: , Rfl:    pregabalin (LYRICA) 50 MG capsule, Take 50 mg by mouth 4 (four) times daily as needed., Disp: , Rfl:    rOPINIRole (REQUIP) 0.5 MG tablet, Take 0.5 mg by mouth at bedtime., Disp: , Rfl:   Social History: Social History   Tobacco Use   Smoking status: Never   Smokeless tobacco: Never  Vaping Use   Vaping status: Never Used  Substance Use Topics   Alcohol use: No   Drug use:  No    Family Medical History: Family History  Problem Relation Age of Onset   Stroke Mother    Breast cancer Neg Hx     Physical Examination: Vitals:   08/23/22 0959  BP: 132/86    General: Patient is in no apparent distress. Attention to examination is appropriate.  Neck:   Supple.  Full range of motion.  Respiratory: Patient is breathing without any difficulty.   NEUROLOGICAL:     Awake, alert, oriented to person, place, and time.  Speech is clear and fluent.   Cranial Nerves: Pupils equal round and reactive to light.  Facial tone is symmetric.  Facial sensation is symmetric. Shoulder shrug is symmetric. Tongue protrusion is midline.  There is no pronator drift.  Strength: Side Biceps Triceps Deltoid Interossei Grip Wrist Ext. Wrist Flex.  R 5 5 5 4 4 5 5   L 5 5 5 4 4 5 5    Side Iliopsoas Quads Hamstring PF DF EHL  R 5 5 5 5 5 5   L 5 5 5 5 5 5    Reflexes are 1+ and symmetric at the biceps, triceps, brachioradialis, patella and achilles.   Hoffman's is absent.   Bilateral upper and lower extremity sensation is intact to light touch.    No evidence of dysmetria noted.  Gait is abnormal and wide-based.  She has moderate difficulty with tandem gait.     Medical Decision Making  Imaging: MRI C spine 07/26/2022 IMPRESSION: 1. Multilevel cervical spondylosis, most pronounced at the C5-6 level where there is moderate-severe canal stenosis and moderate-severe bilateral foraminal stenosis. Findings have slightly progressed at this level. 2. Mild-to-moderate canal stenosis at C4-5 with moderate bilateral foraminal stenosis. 3. Mild canal stenosis at C3-4 with mild bilateral foraminal stenosis.     Electronically Signed   By: Duanne Guess D.O.   On: 07/26/2022 15:48    I have personally reviewed the images and agree with the above interpretation.  Assessment and Plan: Kelly Wells is a pleasant 77 y.o. female with mild symptoms of cervical myelopathy.  This  is mainly due to gait imbalance.  Her arm numbness may be due to peripheral nerve compression.  I would like to get a nerve conduction study to evaluate for possible peripheral nerve issue.  She is scheduled for a cervical epidural steroid injection.  She is not having much pain down her arms, so have recommended against this.  We will review her EMG findings and determine next steps from that.  I spent a total of 30 minutes in this patient's care today. This time was spent reviewing pertinent records including imaging studies, obtaining and confirming history, performing a directed evaluation, formulating and discussing my recommendations, and documenting the visit within the medical record.    Thank you for involving me in the care of this patient.      Kamaal Cast K. Myer Haff MD, [facility] Neurosurgery

## 2022-08-23 ENCOUNTER — Encounter: Payer: Self-pay | Admitting: Neurosurgery

## 2022-08-23 ENCOUNTER — Ambulatory Visit (INDEPENDENT_AMBULATORY_CARE_PROVIDER_SITE_OTHER): Payer: Medicare Other | Admitting: Neurosurgery

## 2022-08-23 VITALS — BP 132/86 | Ht 63.0 in | Wt 166.0 lb

## 2022-08-23 DIAGNOSIS — G959 Disease of spinal cord, unspecified: Secondary | ICD-10-CM | POA: Diagnosis not present

## 2022-08-23 DIAGNOSIS — R200 Anesthesia of skin: Secondary | ICD-10-CM

## 2022-08-28 ENCOUNTER — Encounter: Payer: Self-pay | Admitting: Neurology

## 2022-08-29 ENCOUNTER — Other Ambulatory Visit: Payer: Medicare Other

## 2022-09-05 ENCOUNTER — Ambulatory Visit
Admission: RE | Admit: 2022-09-05 | Discharge: 2022-09-05 | Disposition: A | Discharge status: Home / Self Care | Payer: Medicare Other | Source: Ambulatory Visit | Attending: Infectious Diseases | Admitting: Infectious Diseases

## 2022-09-05 DIAGNOSIS — Z1231 Encounter for screening mammogram for malignant neoplasm of breast: Secondary | ICD-10-CM | POA: Diagnosis present

## 2022-10-02 ENCOUNTER — Other Ambulatory Visit: Payer: Self-pay

## 2022-10-02 DIAGNOSIS — R202 Paresthesia of skin: Secondary | ICD-10-CM

## 2022-10-08 ENCOUNTER — Ambulatory Visit (INDEPENDENT_AMBULATORY_CARE_PROVIDER_SITE_OTHER): Payer: Medicare Other | Admitting: Neurology

## 2022-10-08 DIAGNOSIS — M5412 Radiculopathy, cervical region: Secondary | ICD-10-CM

## 2022-10-08 DIAGNOSIS — R202 Paresthesia of skin: Secondary | ICD-10-CM

## 2022-10-08 DIAGNOSIS — G5603 Carpal tunnel syndrome, bilateral upper limbs: Secondary | ICD-10-CM

## 2022-10-08 NOTE — Procedures (Signed)
[facility] Neurology  [address] [place], [address]  [place], [place] [address] Tel: [phone] Fax: [phone] Test Date:  10/08/2022  Patient: Kelly Wells DOB: April 10, 1945 Physician: Jacquelyne Balint, MD  Sex: Female Height: 5\' 3"  Ref Phys: Venetia Night, MD  ID#: [id number]   Technician:    History: This is a 77 year old female with numbness in her arms.  NCV & EMG Findings: Extensive electrodiagnostic evaluation of bilateral upper limbs shows: Right median sensory response shows prolonged distal peak latency (4.0 ms) and reduced amplitude (9 V). Left median sensory response shows prolonged distal peak latency (4.6 ms). Bilateral ulnar and radial sensory responses are within normal limits. Left median (APB) motor response shows prolonged distal onset latency (4.7 ms) and reduced amplitude (2.8 mV). Right median (APB) and bilateral ulnar (ADM) motor responses are within normal limits. Chronic motor axon loss changes without accompanying active denervation changes are seen in the bilateral abductor pollicis brevis, right first dorsal interosseous, and right extensor indicis proprius muscles.  Impression: This is an abnormal study. The findings are most consistent with the following: Evidence of bilateral median mononeuropathy at or distal to the wrist, consistent with carpal tunnel syndrome, moderate in degree bilaterally. The residuals of an old intraspinal canal lesion (ie: motor radiculopathy) at the right C8 root or segment, mild in degree electrically. There is likely an overlapping right T1 intraspinal canal lesion (ie: motor radiculopathy) as well, which is difficult to grade due to #1 above. No electrodiagnostic evidence of a left cervical (C5-C8) motor radiculopathy. An overlapping left T1 radiculopathy cannot be completely excluded.    ___________________________ Jacquelyne Balint, MD    Nerve Conduction Studies Motor Nerve Results    Latency Amplitude F-Lat Segment  Distance CV Comment  Site (ms) Norm (mV) Norm (ms)  (cm) (m/s) Norm   Left Median (APB) Motor  Wrist *4.7  < 4.0 *2.8  > 5.0        Elbow 10.1 - 2.7 -  Elbow-Wrist 27 50  > 50   Right Median (APB) Motor  Wrist 3.9  < 4.0 5.6  > 5.0        Elbow 9.1 - 5.4 -  Elbow-Wrist 26 50  > 50   Left Ulnar (ADM) Motor  Wrist 2.6  < 3.1 8.0  > 7.0        Bel elbow 6.7 - 6.9 -  Bel elbow-Wrist 20.5 50  > 50   Ab elbow 8.4 - 6.5 -  Ab elbow-Bel elbow 10 59 -   Right Ulnar (ADM) Motor  Wrist 2.4  < 3.1 8.8  > 7.0        Bel elbow 6.1 - 8.5 -  Bel elbow-Wrist 20 54  > 50   Ab elbow 8.0 - 8.4 -  Ab elbow-Bel elbow 10 53 -    Sensory Sites    Neg Peak Lat Amplitude (O-P) Segment Distance Velocity Comment  Site (ms) Norm (V) Norm  (cm) (ms)   Left Median Sensory  Wrist-Dig II *4.6  < 3.8 15  > 10 Wrist-Dig II 13    Right Median Sensory  Wrist-Dig II *4.0  < 3.8 *9  > 10 Wrist-Dig II 13    Left Radial Sensory  Forearm-Wrist 2.2  < 2.8 13  > 10 Forearm-Wrist 10    Right Radial Sensory  Forearm-Wrist 2.5  < 2.8 13  > 10 Forearm-Wrist 10    Left Ulnar Sensory  Wrist-Dig V 3.2  < 3.2  10  > 5 Wrist-Dig V 11    Right Ulnar Sensory  Wrist-Dig V 3.2  < 3.2 10  > 5 Wrist-Dig V 11     Electromyography   Side Muscle Ins.Act Fibs Fasc Recrt Amp Dur Poly Activation Comment  Left FDI Nml Nml Nml Nml Nml Nml Nml Nml N/A  Left EIP Nml Nml Nml Nml Nml Nml Nml Nml N/A  Left APB Nml Nml Nml *2- *1+ *1+ Nml Nml N/A  Left Pronator teres Nml Nml Nml Nml Nml Nml Nml Nml N/A  Left Biceps Nml Nml Nml Nml Nml Nml Nml Nml N/A  Left Triceps Nml Nml Nml Nml Nml Nml Nml Nml N/A  Left Deltoid Nml Nml Nml Nml Nml Nml Nml Nml N/A  Right FDI Nml Nml Nml *1- *1+ *1+ Nml Nml N/A  Right EIP Nml Nml Nml *1- *1+ *1+ Nml Nml N/A  Right APB Nml Nml Nml *2- *1+ *1+ Nml Nml N/A  Right Pronator teres Nml Nml Nml Nml Nml Nml Nml Nml N/A  Right Biceps Nml Nml Nml Nml Nml Nml Nml Nml N/A  Right Triceps Nml Nml Nml Nml Nml Nml Nml Nml  N/A  Right Deltoid Nml Nml Nml Nml Nml Nml Nml Nml N/A  Right C7 PSP Nml Nml Nml Nml Nml Nml Nml Nml N/A      Waveforms:  Motor           Sensory

## 2022-10-12 NOTE — Progress Notes (Unsigned)
Referring Physician:  Venetia Night, MD [address] [place],  [place] [address]  Primary Physician:  Mick Sell, MD  History of Present Illness: 10/17/2022 [name] is here today with a chief complaint of bilateral carpal tunnel syndrome.  She states that when she has her hands in certain positions she does get numbness and tingling in her fingers.  However she states that this does not bother her very often.  She has not noticed persistent numbness or loss of sensation in her hands.  She does not notice any nighttime symptoms.  Overall she states that she does not feel like these are bothersome enough to her that she would warrant any surgery.  She has not had any injections.  Not had physical therapy.  She does wear splints at night which help control her symptoms quite well at this time.  She states that she would like to continue with conservative care for as long as she can.  Review of Systems:  A 10 point review of systems is negative, except for the pertinent positives and negatives detailed in the HPI.  Past Medical History: Past Medical History:  Diagnosis Date   Anemia    Anxiety    Arthritis    KNEES,HANDS   Baker's cyst of knee, right    Depression    Diabetes mellitus without complication (HCC)    type 2   Headache    SINUS ISSUES   Hyperlipidemia    Hypertension    Hypothyroidism    Sinusitis    chronic, with nasal obstruction,tinnitis, and hearing loss   Sleep apnea    NO CPAP   Vertigo    4 day episode end of March 2023    Past Surgical History: Past Surgical History:  Procedure Laterality Date   ABDOMINAL HYSTERECTOMY     BREAST BIOPSY Bilateral 1980's   sugical bx    CARDIAC CATHETERIZATION     YEARS AGO   CATARACT EXTRACTION W/PHACO Right 04/17/2021   Procedure: CATARACT EXTRACTION PHACO AND INTRAOCULAR LENS PLACEMENT (IOC) RIGHT DIABETIC 4.90 00:40.5;  Surgeon: Nevada Crane, MD;  Location: [facility]  SURGERY CNTR;  Service: Ophthalmology;  Laterality: Right;  Diabetic   CATARACT EXTRACTION W/PHACO Left 05/08/2021   Procedure: CATARACT EXTRACTION PHACO AND INTRAOCULAR LENS PLACEMENT (IOC) LEFT DIABETIC 3.24 00:28.5;  Surgeon: Nevada Crane, MD;  Location: [facility] SURGERY [facility];  Service: Ophthalmology;  Laterality: Left;  Diabetic   CHOLECYSTECTOMY     COLONOSCOPY WITH PROPOFOL N/A 07/04/2015   Procedure: COLONOSCOPY WITH PROPOFOL;  Surgeon: Scot Jun, MD;  Location: [facility] ENDOSCOPY;  Service: Endoscopy;  Laterality: N/A;   ETHMOIDECTOMY Left 03/13/2017   Procedure: ANTERIOR ETHMOIDECTOMY WITH FRONTAL SINUSOTOMY;  Surgeon: Bud Face, MD;  Location: [facility] SURGERY CNTR;  Service: ENT;  Laterality: Left;   IMAGE GUIDED SINUS SURGERY Left 03/13/2017   Procedure: IMAGE GUIDED SINUS SURGERY;  Surgeon: Bud Face, MD;  Location: [facility] SURGERY [facility];  Service: ENT;  Laterality: Left;  NEED STRYKER DISK DIABETIC/ SLEEP APNEA-no CPAP GAVE DISK TO CECE 2-28   IR KYPHO EA ADDL LEVEL THORACIC OR LUMBAR  11/16/2021   IR KYPHO LUMBAR INC FX REDUCE BONE BX UNI/BIL CANNULATION INC/IMAGING  02/08/2022   IR KYPHO THORACIC WITH BONE BIOPSY  11/16/2021   IR RADIOLOGIST EVAL & MGMT  11/07/2021   IR RADIOLOGIST EVAL & MGMT  02/01/2022   IR RADIOLOGIST EVAL & MGMT  02/27/2022   JOINT REPLACEMENT Left 2005  knee   MAXILLARY ANTROSTOMY Left 03/13/2017   Procedure: MAXILLARY ANTROSTOMY WITH TISSUE REMOVAL;  Surgeon: Bud Face, MD;  Location: [facility] SURGERY [facility];  Service: ENT;  Laterality: Left;    Allergies: Allergies as of 10/17/2022 - Review Complete 10/17/2022  Allergen Reaction Noted   Dapagliflozin Other (See Comments) 05/28/2022   Keflex [cephalexin] Itching 07/01/2015   Clindamycin/lincomycin Rash 03/06/2017    Medications:  Current Outpatient Medications:    acetaminophen (TYLENOL) 650 MG CR tablet, Take 650 mg by mouth every 8 (eight) hours as needed., Disp: , Rfl:     alendronate (FOSAMAX) 70 MG tablet, Take 70 mg by mouth once a week. Take with a full glass of water on an empty stomach., Disp: , Rfl:    clonazePAM (KLONOPIN) 0.5 MG tablet, Take 0.5 mg by mouth 2 (two) times daily as needed for anxiety., Disp: , Rfl:    DULoxetine (CYMBALTA) 20 MG capsule, Take 40 mg by mouth daily., Disp: , Rfl:    Exenatide ER (BYDUREON BCISE) 2 MG/0.85ML AUIJ, Inject 2 mLs into the skin once a week., Disp: , Rfl:    gabapentin (NEURONTIN) 600 MG tablet, Take 600 mg by mouth 3 (three) times daily. 600 mg AM, 600 mg lunchtime, 1200 mg PM, Disp: , Rfl:    ibuprofen (ADVIL) 600 MG tablet, Take 600 mg by mouth as needed., Disp: , Rfl:    levothyroxine (SYNTHROID) 100 MCG tablet, Take 100 mcg by mouth daily before breakfast., Disp: , Rfl:    lisinopril (ZESTRIL) 10 MG tablet, Take 10 mg by mouth daily., Disp: , Rfl:    meclizine (ANTIVERT) 25 MG tablet, Take 25 mg by mouth 3 (three) times daily as needed for dizziness., Disp: , Rfl:    metFORMIN (GLUCOPHAGE) 1000 MG tablet, Take 1,000 mg by mouth 2 (two) times daily with a meal., Disp: , Rfl:    metoprolol succinate (TOPROL-XL) 25 MG 24 hr tablet, Take 25 mg by mouth daily., Disp: , Rfl:    pramipexole (MIRAPEX) 0.25 MG tablet, Take 0.25 mg by mouth daily., Disp: , Rfl:    pregabalin (LYRICA) 50 MG capsule, Take 50 mg by mouth 4 (four) times daily as needed., Disp: , Rfl:   Social History: Social History   Tobacco Use   Smoking status: Never   Smokeless tobacco: Never  Vaping Use   Vaping status: Never Used  Substance Use Topics   Alcohol use: No   Drug use: No    Family Medical History: Family History  Problem Relation Age of Onset   Stroke Mother    Breast cancer Neg Hx     Physical Examination: Vitals:   10/17/22 1116  BP: 134/88    General: Patient is in no apparent distress. Attention to examination is appropriate.  Neck:   Supple.  Full range of motion.  Respiratory: Patient is breathing without any  difficulty.   NEUROLOGICAL:     Awake, alert, oriented to person, place, and time.  Speech is clear and fluent.   Cranial Nerves: Pupils equal round and reactive to light.  Facial tone is symmetric. Shoulder shrug is symmetric. Tongue protrusion is midline.  There is no pronator drift.  Motor Exam:  No gross wasting in the median distribution.  5 out of 5 strength in all intrinsic median musculature. Carpal compression test is Negative, Phalen's and reverse Phalen's is positive.  Tinel is negative.   Medical Decision Making  Electrodiagnostics:  [facility]  [address] [place], [address]  [place], [place] [address] Tel: [phone] Fax: [phone] Test Date:  10/08/2022   Patient: Makinzey Banes DOB: July 02, 1945 Physician: Jacquelyne Balint, MD  Sex: Female Height: 5\' 3"  Ref Phys: Venetia Night, MD  ID#: [id number]     Technician:      History: This is a 77 year old female with numbness in her arms.   NCV & EMG Findings: Extensive electrodiagnostic evaluation of bilateral upper limbs shows: Right median sensory response shows prolonged distal peak latency (4.0 ms) and reduced amplitude (9 V). Left median sensory response shows prolonged distal peak latency (4.6 ms). Bilateral ulnar and radial sensory responses are within normal limits. Left median (APB) motor response shows prolonged distal onset latency (4.7 ms) and reduced amplitude (2.8 mV). Right median (APB) and bilateral ulnar (ADM) motor responses are within normal limits. Chronic motor axon loss changes without accompanying active denervation changes are seen in the bilateral abductor pollicis brevis, right first dorsal interosseous, and right extensor indicis proprius muscles.   Impression: This is an abnormal study. The findings are most consistent with the following: Evidence of bilateral median mononeuropathy at or distal to the wrist, consistent with carpal tunnel syndrome, moderate in degree  bilaterally. The residuals of an old intraspinal canal lesion (ie: motor radiculopathy) at the right C8 root or segment, mild in degree electrically. There is likely an overlapping right T1 intraspinal canal lesion (ie: motor radiculopathy) as well, which is difficult to grade due to #1 above. No electrodiagnostic evidence of a left cervical (C5-C8) motor radiculopathy. An overlapping left T1 radiculopathy cannot be completely excluded.       ___________________________ Jacquelyne Balint, MD     Nerve Conduction Studies Motor Nerve Results                 Latency Amplitude F-Lat Segment Distance CV Comment  Site (ms) Norm (mV) Norm (ms)   (cm) (m/s) Norm    Left Median (APB) Motor  Wrist *4.7  < 4.0 *2.8  > 5.0              Elbow 10.1 - 2.7 -   Elbow-Wrist 27 50  > 50    Right Median (APB) Motor  Wrist 3.9  < 4.0 5.6  > 5.0              Elbow 9.1 - 5.4 -   Elbow-Wrist 26 50  > 50    Left Ulnar (ADM) Motor  Wrist 2.6  < 3.1 8.0  > 7.0              Bel elbow 6.7 - 6.9 -   Bel elbow-Wrist 20.5 50  > 50    Ab elbow 8.4 - 6.5 -   Ab elbow-Bel elbow 10 59 -    Right Ulnar (ADM) Motor  Wrist 2.4  < 3.1 8.8  > 7.0              Bel elbow 6.1 - 8.5 -   Bel elbow-Wrist 20 54  > 50    Ab elbow 8.0 - 8.4 -   Ab elbow-Bel elbow 10 53 -      Sensory Sites               Neg Peak Lat Amplitude (O-P) Segment Distance Velocity Comment  Site (ms) Norm (V) Norm   (cm) (ms)    Left Median Sensory  Wrist-Dig II *4.6  < 3.8 15  > 10 Wrist-Dig II 13  Right Median Sensory  Wrist-Dig II *4.0  < 3.8 *9  > 10 Wrist-Dig II 13      Left Radial Sensory  Forearm-Wrist 2.2  < 2.8 13  > 10 Forearm-Wrist 10      Right Radial Sensory  Forearm-Wrist 2.5  < 2.8 13  > 10 Forearm-Wrist 10      Left Ulnar Sensory  Wrist-Dig V 3.2  < 3.2 10  > 5 Wrist-Dig V 11      Right Ulnar Sensory  Wrist-Dig V 3.2  < 3.2 10  > 5 Wrist-Dig V 11        Electromyography    Side Muscle Ins.Act Fibs Fasc Recrt Amp Dur Poly  Activation Comment  Left FDI Nml Nml Nml Nml Nml Nml Nml Nml N/A  Left EIP Nml Nml Nml Nml Nml Nml Nml Nml N/A  Left APB Nml Nml Nml *2- *1+ *1+ Nml Nml N/A  Left Pronator teres Nml Nml Nml Nml Nml Nml Nml Nml N/A  Left Biceps Nml Nml Nml Nml Nml Nml Nml Nml N/A  Left Triceps Nml Nml Nml Nml Nml Nml Nml Nml N/A  Left Deltoid Nml Nml Nml Nml Nml Nml Nml Nml N/A  Right FDI Nml Nml Nml *1- *1+ *1+ Nml Nml N/A  Right EIP Nml Nml Nml *1- *1+ *1+ Nml Nml N/A  Right APB Nml Nml Nml *2- *1+ *1+ Nml Nml N/A  Right Pronator teres Nml Nml Nml Nml Nml Nml Nml Nml N/A  Right Biceps Nml Nml Nml Nml Nml Nml Nml Nml N/A  Right Triceps Nml Nml Nml Nml Nml Nml Nml Nml N/A  Right Deltoid Nml Nml Nml Nml Nml Nml Nml Nml N/A  Right C7 PSP Nml Nml Nml Nml Nml Nml Nml Nml N/A       I have personally reviewed the images and electrodiagnostics and agree with the above interpretation.  Assessment and Plan: Ms. Bigler is a pleasant 77 y.o. female with bilateral carpal tunnel syndrome.  She states that this point that they are not severe enough to warrant any intervention.  She would like to continue conservative care if possible.  She does not get any nighttime symptoms.  She is well-controlled with nighttime bracing.  She states that she gets some symptoms in certain right hand positions but is able to just change and positions and tolerate it.  She does not want to pursue any further injections.  She does not want to pursue decompression.  She would like to follow-up in approximately 1 year to see whether or not she is having any progression of her carpal tunnel syndrome.  Will plan to see her then and discuss further management.  She is currently at moderate carpal tunnel syndrome by electrodiagnostic testing.   Thank you for involving me in the care of this patient.    Lovenia Kim MD/MSCR Neurosurgery - Peripheral Nerve Surgery

## 2022-10-17 ENCOUNTER — Encounter: Payer: Self-pay | Admitting: Neurosurgery

## 2022-10-17 ENCOUNTER — Ambulatory Visit (INDEPENDENT_AMBULATORY_CARE_PROVIDER_SITE_OTHER): Payer: Medicare Other | Admitting: Neurosurgery

## 2022-10-17 VITALS — BP 134/88 | Ht 63.0 in | Wt 166.0 lb

## 2022-10-17 DIAGNOSIS — G5603 Carpal tunnel syndrome, bilateral upper limbs: Secondary | ICD-10-CM | POA: Diagnosis not present

## 2022-10-19 ENCOUNTER — Other Ambulatory Visit: Payer: Self-pay | Admitting: Infectious Diseases

## 2022-10-19 DIAGNOSIS — R7989 Other specified abnormal findings of blood chemistry: Secondary | ICD-10-CM

## 2022-10-22 ENCOUNTER — Ambulatory Visit
Admission: RE | Admit: 2022-10-22 | Discharge: 2022-10-22 | Disposition: A | Discharge status: Home / Self Care | Payer: Medicare Other | Source: Ambulatory Visit | Attending: Infectious Diseases | Admitting: Infectious Diseases

## 2022-10-22 DIAGNOSIS — R7989 Other specified abnormal findings of blood chemistry: Secondary | ICD-10-CM | POA: Insufficient documentation

## 2022-10-24 ENCOUNTER — Other Ambulatory Visit: Payer: Self-pay | Admitting: Student

## 2022-10-24 DIAGNOSIS — S0990XA Unspecified injury of head, initial encounter: Secondary | ICD-10-CM

## 2022-10-29 ENCOUNTER — Ambulatory Visit
Admission: RE | Admit: 2022-10-29 | Discharge: 2022-10-29 | Disposition: A | Discharge status: Home / Self Care | Payer: Medicare Other | Source: Ambulatory Visit | Attending: Student

## 2022-10-29 DIAGNOSIS — S0990XA Unspecified injury of head, initial encounter: Secondary | ICD-10-CM

## 2023-08-01 ENCOUNTER — Emergency Department

## 2023-08-01 ENCOUNTER — Emergency Department
Admission: EM | Admit: 2023-08-01 | Discharge: 2023-08-01 | Disposition: A | Discharge status: Home / Self Care | Attending: Emergency Medicine | Admitting: Emergency Medicine

## 2023-08-01 ENCOUNTER — Encounter: Payer: Self-pay | Admitting: *Deleted

## 2023-08-01 ENCOUNTER — Other Ambulatory Visit: Payer: Self-pay

## 2023-08-01 DIAGNOSIS — I10 Essential (primary) hypertension: Secondary | ICD-10-CM | POA: Insufficient documentation

## 2023-08-01 DIAGNOSIS — E875 Hyperkalemia: Secondary | ICD-10-CM | POA: Insufficient documentation

## 2023-08-01 DIAGNOSIS — E119 Type 2 diabetes mellitus without complications: Secondary | ICD-10-CM | POA: Insufficient documentation

## 2023-08-01 DIAGNOSIS — S29001A Unspecified injury of muscle and tendon of front wall of thorax, initial encounter: Secondary | ICD-10-CM | POA: Diagnosis present

## 2023-08-01 DIAGNOSIS — W28XXXA Contact with powered lawn mower, initial encounter: Secondary | ICD-10-CM | POA: Diagnosis not present

## 2023-08-01 DIAGNOSIS — S2242XA Multiple fractures of ribs, left side, initial encounter for closed fracture: Secondary | ICD-10-CM | POA: Insufficient documentation

## 2023-08-01 LAB — BASIC METABOLIC PANEL WITH GFR
Anion gap: 9 (ref 5–15)
Anion gap: 9 (ref 5–15)
BUN: 37 mg/dL — ABNORMAL HIGH (ref 8–23)
BUN: 35 mg/dL — ABNORMAL HIGH (ref 8–23)
CO2: 24 mmol/L (ref 22–32)
CO2: 24 mmol/L (ref 22–32)
Calcium: 9.4 mg/dL (ref 8.9–10.3)
Calcium: 9.5 mg/dL (ref 8.9–10.3)
Chloride: 105 mmol/L (ref 98–111)
Chloride: 104 mmol/L (ref 98–111)
Creatinine, Ser: 1 mg/dL (ref 0.44–1.00)
Creatinine, Ser: 1 mg/dL (ref 0.44–1.00)
GFR, Estimated: 58 mL/min — ABNORMAL LOW (ref >60)
GFR, Estimated: 58 mL/min — ABNORMAL LOW (ref >60)
Glucose, Bld: 103 mg/dL — ABNORMAL HIGH (ref 70–99)
Glucose, Bld: 106 mg/dL — ABNORMAL HIGH (ref 70–99)
Potassium: 6.1 mmol/L — ABNORMAL HIGH (ref 3.5–5.1)
Potassium: 5.9 mmol/L — ABNORMAL HIGH (ref 3.5–5.1)
Sodium: 138 mmol/L (ref 135–145)
Sodium: 137 mmol/L (ref 135–145)

## 2023-08-01 LAB — CBC
HCT: 39.6 % (ref 36.0–46.0)
Hemoglobin: 12.6 g/dL (ref 12.0–15.0)
MCH: 31.2 pg (ref 26.0–34.0)
MCHC: 31.8 g/dL (ref 30.0–36.0)
MCV: 98 fL (ref 80.0–100.0)
Platelets: 158 K/uL (ref 150–400)
RBC: 4.04 MIL/uL (ref 3.87–5.11)
RDW: 13.4 % (ref 11.5–15.5)
WBC: 6.4 K/uL (ref 4.0–10.5)
nRBC: 0 % (ref 0.0–0.2)

## 2023-08-01 LAB — TROPONIN I (HIGH SENSITIVITY)
Troponin I (High Sensitivity): 5 ng/L (ref <18)
Troponin I (High Sensitivity): 4 ng/L (ref <18)

## 2023-08-01 MED ORDER — OXYCODONE HCL 5 MG PO TABS: 2.5000 mg | ORAL_TABLET | Freq: Four times a day (QID) | ORAL | 0 refills | Status: AC | PRN

## 2023-08-01 MED ORDER — ACETAMINOPHEN 325 MG PO TABS
650.0000 mg | ORAL_TABLET | Freq: Once | ORAL | Status: AC
  Administered 2023-08-01: 650 mg via ORAL
  Filled 2023-08-01: qty 2

## 2023-08-01 NOTE — ED Notes (Signed)
Patient given discharge instructions including prescriptions x1 and importance of follow up appt as needed with stated understanding. INT removed, cannula intact, pressure dressing applied. Patient stable and ambulatory with steady even gait on dispo.
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
## 2023-09-17 ENCOUNTER — Emergency Department

## 2023-09-17 ENCOUNTER — Other Ambulatory Visit: Payer: Self-pay

## 2023-09-17 ENCOUNTER — Emergency Department: Admission: EM | Admit: 2023-09-17 | Discharge: 2023-09-17 | Discharge status: Against Medical Advice

## 2023-09-17 ENCOUNTER — Encounter: Payer: Self-pay | Admitting: Emergency Medicine

## 2023-09-17 DIAGNOSIS — M546 Pain in thoracic spine: Secondary | ICD-10-CM | POA: Diagnosis not present

## 2023-09-17 DIAGNOSIS — E875 Hyperkalemia: Secondary | ICD-10-CM | POA: Insufficient documentation

## 2023-09-17 DIAGNOSIS — I10 Essential (primary) hypertension: Secondary | ICD-10-CM | POA: Diagnosis not present

## 2023-09-17 DIAGNOSIS — E119 Type 2 diabetes mellitus without complications: Secondary | ICD-10-CM | POA: Insufficient documentation

## 2023-09-17 DIAGNOSIS — K769 Liver disease, unspecified: Secondary | ICD-10-CM

## 2023-09-17 DIAGNOSIS — S36119A Unspecified injury of liver, initial encounter: Secondary | ICD-10-CM | POA: Insufficient documentation

## 2023-09-17 DIAGNOSIS — D508 Other iron deficiency anemias: Secondary | ICD-10-CM | POA: Insufficient documentation

## 2023-09-17 DIAGNOSIS — W19XXXA Unspecified fall, initial encounter: Secondary | ICD-10-CM | POA: Diagnosis not present

## 2023-09-17 LAB — CBC WITH DIFFERENTIAL/PLATELET
Abs Immature Granulocytes: 0.02 K/uL (ref 0.00–0.07)
Basophils Absolute: 0 K/uL (ref 0.0–0.1)
Basophils Relative: 1 %
Eosinophils Absolute: 0.1 K/uL (ref 0.0–0.5)
Eosinophils Relative: 1 %
HCT: 36.5 % (ref 36.0–46.0)
Hemoglobin: 11.8 g/dL — ABNORMAL LOW (ref 12.0–15.0)
Immature Granulocytes: 1 %
Lymphocytes Relative: 24 %
Lymphs Abs: 1 K/uL (ref 0.7–4.0)
MCH: 32.2 pg (ref 26.0–34.0)
MCHC: 32.3 g/dL (ref 30.0–36.0)
MCV: 99.5 fL (ref 80.0–100.0)
Monocytes Absolute: 0.3 K/uL (ref 0.1–1.0)
Monocytes Relative: 7 %
Neutro Abs: 2.9 K/uL (ref 1.7–7.7)
Neutrophils Relative %: 66 %
Platelets: 98 K/uL — ABNORMAL LOW (ref 150–400)
RBC: 3.67 MIL/uL — ABNORMAL LOW (ref 3.87–5.11)
RDW: 13 % (ref 11.5–15.5)
Smear Review: NORMAL
WBC: 4.3 K/uL (ref 4.0–10.5)
nRBC: 0 % (ref 0.0–0.2)

## 2023-09-17 LAB — COMPREHENSIVE METABOLIC PANEL WITH GFR
ALT: 100 U/L — ABNORMAL HIGH (ref 0–44)
AST: 83 U/L — ABNORMAL HIGH (ref 15–41)
Albumin: 4 g/dL (ref 3.5–5.0)
Alkaline Phosphatase: 306 U/L — ABNORMAL HIGH (ref 38–126)
Anion gap: 9 (ref 5–15)
BUN: 14 mg/dL (ref 8–23)
CO2: 24 mmol/L (ref 22–32)
Calcium: 9.7 mg/dL (ref 8.9–10.3)
Chloride: 108 mmol/L (ref 98–111)
Creatinine, Ser: 0.92 mg/dL (ref 0.44–1.00)
GFR, Estimated: >60 mL/min (ref >60)
Glucose, Bld: 104 mg/dL — ABNORMAL HIGH (ref 70–99)
Potassium: 6.1 mmol/L — ABNORMAL HIGH (ref 3.5–5.1)
Sodium: 141 mmol/L (ref 135–145)
Total Bilirubin: 1.5 mg/dL — ABNORMAL HIGH (ref 0.0–1.2)
Total Protein: 7.5 g/dL (ref 6.5–8.1)

## 2023-09-17 MED ORDER — LIDOCAINE 5 % EX PTCH
1.0000 | MEDICATED_PATCH | CUTANEOUS | Status: DC
  Administered 2023-09-17: 1 via TRANSDERMAL
  Filled 2023-09-17: qty 1

## 2023-09-17 MED ORDER — IBUPROFEN 600 MG PO TABS: 600.0000 mg | ORAL_TABLET | Freq: Once | ORAL | Status: DC

## 2023-09-17 MED ORDER — KETOROLAC TROMETHAMINE 60 MG/2ML IM SOLN
60.0000 mg | Freq: Once | INTRAMUSCULAR | Status: AC
  Administered 2023-09-17: 60 mg via INTRAMUSCULAR
  Filled 2023-09-17: qty 2

## 2023-09-17 NOTE — ED Provider Notes (Signed)
 [facility] Provider Note    Event Date/Time   First MD Initiated Contact with Patient 09/17/23 1707     (approximate)   History   Fall    HPI  Kelly Wells is a 78 y.o. female    with a past medical history of chronic hyperkalemia, knee pain, diabetes type 2, elevated LFTs, essential hypertension, memory changes,  who presents to the ED complaining of fall  . According to the patient, she fell on September 4, after feeling dizzy.  Patient denies loss of consciousness.  Patient endorses having posterior thorax pain that increased with deep breath, movement.  Patient is not taking blood thinners.  Patient has been thinking acetaminophen  without relief of the pain.  Patient denies chest pain, abdominal pain, diarrhea, urinary symptoms.  Patient is here by herself.  Per independent chart review, patient had lab work in August with decreased GFR.    There are no active problems to display for this patient.   ROS: Patient currently denies any vision changes, tinnitus, difficulty speaking, facial droop, sore throat, chest pain, shortness of breath, abdominal pain, nausea/vomiting/diarrhea, dysuria, or weakness/numbness/paresthesias in any extremity   Physical Exam   Triage Vital Signs: ED Triage Vitals  Encounter Vitals Group     BP 09/17/23 1614 (!) 174/90     Girls Systolic BP Percentile --      Girls Diastolic BP Percentile --      Boys Systolic BP Percentile --      Boys Diastolic BP Percentile --      Pulse Rate 09/17/23 1614 93     Resp 09/17/23 1614 18     Temp 09/17/23 1614 97.9 F (36.6 C)     Temp Source 09/17/23 1614 Oral     SpO2 09/17/23 1614 100 %     Weight 09/17/23 1614 172 lb (78 kg)     Height 09/17/23 1614 5' 1 (1.549 m)     Head Circumference --      Peak Flow --      Pain Score 09/17/23 1619 5     Pain Loc --      Pain Education --      Exclude from Growth Chart --     Most recent vital signs: Vitals:   09/17/23 1614  BP:  (!) 174/90  Pulse: 93  Resp: 18  Temp: 97.9 F (36.6 C)  SpO2: 100%     Physical Exam Vitals and nursing note reviewed.  During triage patient was hypertensive  Constitutional:      General: Awake and alert. No acute distress.    Appearance: Normal appearance. The patient is normal weight.      Able to speak in complete sentences without cough or dyspnea  HENT:     Head: Normocephalic and atraumatic.     Mouth: Mucous membranes are moist.  Eyes:     General: PERRL. Normal EOMs          Conjunctiva/sclera: Conjunctivae normal.  Nose No congestion/rhinorrhea  CV:                  Good peripheral perfusion.  Regular rate and rhythm  Resp:               Normal effort.  Equal breath sounds bilaterally.  Abd:                 No distention.  Soft, nontender.  No rebound or guarding.  Musculoskeletal:  General: No swelling. Normal range of motion.  Posterior thoracic spine: Skin is intact, no ecchymosis, no hematomas. Tenderness to palpation at the spinal process, tenderness in the right paraspinal muscles.  Skin:    General: Skin is warm and dry.     Capillary Refill: Capillary refill takes less than 2 seconds.     Findings: No rash.  Neurological:     Mental Status: The patient is awake and alert. MAE spontaneously. No gross focal neurologic deficits are appreciated.  Psychiatric Mood and affect are normal. Speech and behavior are normal.  ED Results / Procedures / Treatments   Labs (all labs ordered are listed, but only abnormal results are displayed) Labs Reviewed  COMPREHENSIVE METABOLIC PANEL WITH GFR - Abnormal; Notable for the following components:      Result Value   Potassium 6.1 (*)    Glucose, Bld 104 (*)    AST 83 (*)    ALT 100 (*)    Alkaline Phosphatase 306 (*)    Total Bilirubin 1.5 (*)    All other components within normal limits  CBC WITH DIFFERENTIAL/PLATELET - Abnormal; Notable for the following components:   RBC 3.67 (*)    Hemoglobin 11.8  (*)    Platelets 98 (*)    All other components within normal limits     EKG See physician read    RADIOLOGY I independently reviewed and interpreted imaging and agree with radiologists findings.      PROCEDURES:  Critical Care performed:   Procedures   MEDICATIONS ORDERED IN ED: Medications  lidocaine  (LIDODERM ) 5 % 1 patch (1 patch Transdermal Patch Applied 09/17/23 1839)  ketorolac  (TORADOL ) injection 60 mg (60 mg Intramuscular Given 09/17/23 1840)   Clinical Course as of 09/17/23 2043  Tue Sep 17, 2023  1917 DG Chest 2 View Low lung volumes. Areas of perihilar and lingular atelectasis. No effusions or pneumothorax. No acute bony abnormality. 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   [AE]  1921 Updated patient with x-ray results, reassessed the patient about pain, patient endorses feeling better.  Only hurts when she moves.  Updated patient with results of CMP.  Hyperkalemia, AST and ALT elevated, alkaline phosphate and total bilirubin is elevated. [AE]  1937 Anemia, hemoglobin 11.8, thrombocytopenia, platelets 98.  Platelets and hemoglobin are lower when compared with CBC from a month ago. [AE]  1953 Consulted patient with Dr. Nicholaus.  She personally talk with the patient.  Patient needs to be admitted due to his potassium levels and liver function test.  Patient manifested she wants to be discharged to go home and take care of her dogs.  If patient is discharged it will be AGAINST MEDICAL ADVICE.  [AE]  2031 Reassessed the patient, patient desires to be discharged.  Patient is to take care of her dogs [AE]    Clinical Course User Index [AE] Janit Kast, PA-C    IMPRESSION / MDM / ASSESSMENT AND PLAN / ED COURSE  I reviewed the triage vital signs and the nursing notes.  Differential diagnosis includes, but is not limited to, fracture, soft tissue injury, electrolyte imbalance  Patient's presentation is most  consistent with acute complicated illness / injury requiring diagnostic workup.    Kelly Wells is a 78 y.o., female who presents today with history of posterior thoracic pain after falling on September 4.  Physical exam there is tenderness to palpation in the thoracic spinal process, and right paraspinal muscle.  Rest of the physical exam is normal Plan Toradol  Lidocaine  patch Thoracic x-ray CBC, CMP Reassess Reassessed the patient with lab work results, hyperkalemia, liver function test elevated, anemia.  Offered to the patient to be admitted, do EKG, CT of the abdomen.  Patient refused to be admitted.  He has dogs at home and she needs to be discharged.  Dr. Nicholaus personally talk with her about her risk of sudden death due to her potassium levels.  She explained to the patient that she can leave voluntarily but is AGAINST MEDICAL ADVICE.  Patient's diagnosis is consistent with hyperkalemia, hyperbilirubinemia, anemia, posterior thoracic pain. . I independently reviewed and interpreted imaging and agree with radiologists findings. Labs are  reassuring. I did review the patient's allergies and medications.  09/17/2023 at 8:34 PM:  The patient requested to leave.  I considered this to be leaving against medical advice. I personally discussed the following with them:   That they currently had a medical condition of hyperkalemia, hyperbilirubinemia, liver function test elevated, posterior thoracic pain and I am concerned that they may have cardiac arrhythmia, liver disease.  Patient is at risk of sudden death.  My proposed course of evaluation and treatment includes, but is not limited to, EKG, abdominal CT, admission, treatment for hyperkalemia.  Benefits of staying include possible diagnosis or excluding of arrhythmias, myocardial infarction or an alternative serious condition such as sudden death, which if identified early would lead to appropriate intervention in a timely manner lessening the  burden of disability and death.  Risks of leaving before this had been completed include: misdiagnosis, worsening illness leading up to and including prolonged or permanent disability or death.  Specific risks pertinent, but not all inclusive, of their current medical condition include but are not limited to cardiac arrhythmia, cardiac arrest.  I also discussed alternatives including liver disease.  Despite this they stated they wanted to leave due to the need to take care of her dogs at home and refused further evaluation, treatment, or admission at this time.   They appeared clinically sober, were mentating appropriately, were free from distracting injury, had adequately controlled acute pain, appeared to have intact insight, judgment, and reason, and in my opinion had the capacity to make this decision.  Specifically, they were able to verbally state back in a coherent manner their current medical condition/current diagnosis, the proposed course of evaluation and/or treatment, and the risks, benefits, and alternatives of treatment versus leaving against medical advice.   They understand that they may return to seek medical attention here at ANY time they want.  I strongly advised them to return to the Emergency Department immediately if they experience any new or worsening symptoms that concern them, or simply if they reconsider continued evaluation and/or treatment as previously discussed.  This would be without any repercussions, though they understand they likely will need to wait again in the Emergency Department if other patients are in front of them, rather than being brought straight back.  They understood this is another advantage of staying, but still insisted upon leaving.  I recommended they follow-up with PCP in [facility] at the earliest available opportunity/appointment for further evaluation and treatment.   The patient was discharged against medical advice.  They did accept  written discharge instructions.   FINAL CLINICAL IMPRESSION(S) / ED DIAGNOSES   Final diagnoses:  Hyperkalemia  Acute right-sided thoracic back pain  Other iron deficiency anemia  Hyperbilirubinemia  Liver cell injury     Rx /  DC Orders   ED Discharge Orders     None        Note:  This document was prepared using Dragon voice recognition software and may include unintentional dictation errors.   Janit Kast, PA-C 09/17/23 2043    Nicholaus Rolland BRAVO, MD 09/18/23 0100

## 2023-09-17 NOTE — Discharge Instructions (Addendum)
 You have been diagnosed with hyperkalemia, acute right-sided thoracic back pain, anemia, hyperbilirubinemia.  You decided to be discharged AGAINST MEDICAL ADVICE.  You were informed about the risk of sudden death with hyperkalemia 6.1.  You refused to have further testing.  You decided to go home to take care of the dogs.  Please go tomorrow and see Dr. Epifanio.  This is a serious condition.
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
## 2023-09-17 NOTE — ED Notes (Signed)
 Went over labs with the patient and explained to her the same thing that had been explained by the provider.  Educated her on potassium and how it is the electric electrolyte and can cause cardiac arrest for being too low or too high.  Her potassium is trending up and that needs to be addressed with her PCP since she is refusing to stay.  Also discussed liver panels and explained that as well.  They were normal and now they are not.  This needs to be looked at as well.  I told her I could not tell her why there are changes but I am only educating her the same way I would educate my mother if I saw these numbers.  She said she would take this information to her PCP and ask questions.

## 2023-09-18 ENCOUNTER — Other Ambulatory Visit: Payer: Self-pay | Admitting: Infectious Diseases

## 2023-09-18 DIAGNOSIS — R7989 Other specified abnormal findings of blood chemistry: Secondary | ICD-10-CM

## 2023-09-19 ENCOUNTER — Ambulatory Visit
Admission: RE | Admit: 2023-09-19 | Discharge: 2023-09-19 | Disposition: A | Discharge status: Home / Self Care | Source: Ambulatory Visit | Attending: Infectious Diseases | Admitting: Infectious Diseases

## 2023-09-19 DIAGNOSIS — R7989 Other specified abnormal findings of blood chemistry: Secondary | ICD-10-CM | POA: Insufficient documentation

## 2023-09-26 ENCOUNTER — Ambulatory Visit
Admission: RE | Admit: 2023-09-26 | Discharge: 2023-09-26 | Disposition: A | Discharge status: Home / Self Care | Source: Ambulatory Visit | Attending: Family Medicine | Admitting: Family Medicine

## 2023-09-26 ENCOUNTER — Other Ambulatory Visit: Payer: Self-pay | Admitting: Family Medicine

## 2023-09-26 DIAGNOSIS — Z8781 Personal history of (healed) traumatic fracture: Secondary | ICD-10-CM | POA: Insufficient documentation

## 2023-09-27 ENCOUNTER — Other Ambulatory Visit: Payer: Self-pay | Admitting: Family Medicine

## 2023-09-27 DIAGNOSIS — S32010G Wedge compression fracture of first lumbar vertebra, subsequent encounter for fracture with delayed healing: Secondary | ICD-10-CM

## 2023-10-03 ENCOUNTER — Ambulatory Visit
Admission: RE | Admit: 2023-10-03 | Discharge: 2023-10-03 | Disposition: A | Discharge status: Home / Self Care | Source: Ambulatory Visit | Attending: Family Medicine | Admitting: Family Medicine

## 2023-10-03 DIAGNOSIS — S32010G Wedge compression fracture of first lumbar vertebra, subsequent encounter for fracture with delayed healing: Secondary | ICD-10-CM

## 2023-10-03 HISTORY — PX: IR RADIOLOGIST EVAL & MGMT: IMG5224

## 2023-10-03 NOTE — Consult Note (Signed)
 Chief Complaint: Patient was seen in consultation today for L1 compression fracture  Referring Physician(s): Meeler,Whitney L  Supervising Physician: Karalee Beat  Patient Status: [facility]  History of Present Illness: Kelly Wells is a 78 y.o. female with past medical history of anemia, arthritis, HTN, HLD, DM osteoporosis (per DXA bone scan 03/2022) known to IR from prior kyphoplasty for T5 and T6 compression fracture 11/16/21 as well as T11 compression fracture s/p kyphoplasty 02/08/22.   She was reportedly doing well since her procedure until July of this year when she was injured while mowing the lawn.  She was seen in the ED 08/01/23 at which time CT Chest showed left anterior 4th-6th rib fractures.  With ongoing back pain after sustaining a fall at home 3 weeks ago, she was seen in follow-up with her PCP on 09/19/23 at which time designated back MR imaging showed ongoing changes at T5, T6, and T11, with prior L1 compression fracture with retropulsion as well as new T7 compression fracture. She presents to [facility] today at the referral of Benton Pringle, NP for ongoing, intractable back pain to discuss management and intervention related to her T7 compression fracture.  Patient presents today  in her usual state of health.  She confirms the history above and adds that her pain has been poorly managed since her most recent fall.  She rates her pain as 8/10 at baseline with only moderate improvement to 5/10 with narcotic pain medication.  She tries not to take her narcotic pain medicine as she is the sole caretaker of her home.  The fall with subsequent pain has limited her mobility as well activities of daily living.  She has required increased assistance from family/hired-help to perform activities around the home.  She desires intervention to better control her pain, wean from narcotic pain medication, and maintain her active lifestyle/ADLs.    Past Medical History:  Diagnosis  Date   Anemia    Anxiety    Arthritis    KNEES,HANDS   Baker's cyst of knee, right    Depression    Diabetes mellitus without complication (HCC)    type 2   Headache    SINUS ISSUES   Hyperlipidemia    Hypertension    Hypothyroidism    Sinusitis    chronic, with nasal obstruction,tinnitis, and hearing loss   Sleep apnea    NO CPAP   Vertigo    4 day episode end of March 2023    Past Surgical History:  Procedure Laterality Date   ABDOMINAL HYSTERECTOMY     BREAST BIOPSY Bilateral 1980's   sugical bx    CARDIAC CATHETERIZATION     YEARS AGO   CATARACT EXTRACTION W/PHACO Right 04/17/2021   Procedure: CATARACT EXTRACTION PHACO AND INTRAOCULAR LENS PLACEMENT (IOC) RIGHT DIABETIC 4.90 00:40.5;  Surgeon: Myrna Adine Anes, MD;  Location: [facility];  Service: Ophthalmology;  Laterality: Right;  Diabetic   CATARACT EXTRACTION W/PHACO Left 05/08/2021   Procedure: CATARACT EXTRACTION PHACO AND INTRAOCULAR LENS PLACEMENT (IOC) LEFT DIABETIC 3.24 00:28.5;  Surgeon: Myrna Adine Anes, MD;  Location: [facility];  Service: Ophthalmology;  Laterality: Left;  Diabetic   CHOLECYSTECTOMY     COLONOSCOPY WITH PROPOFOL  N/A 07/04/2015   Procedure: COLONOSCOPY WITH PROPOFOL ;  Surgeon: Lamar ONEIDA Holmes, MD;  Location: [facility];  Service: Endoscopy;  Laterality: N/A;   ETHMOIDECTOMY Left 03/13/2017   Procedure: ANTERIOR ETHMOIDECTOMY WITH FRONTAL SINUSOTOMY;  Surgeon: Milissa Hamming, MD;  Location: [facility];  Service: ENT;  Laterality: Left;   IMAGE GUIDED SINUS SURGERY Left 03/13/2017   Procedure: IMAGE GUIDED SINUS SURGERY;  Surgeon: Milissa Hamming, MD;  Location: [facility];  Service: ENT;  Laterality: Left;  NEED STRYKER DISK DIABETIC/ SLEEP APNEA-no CPAP GAVE DISK TO CECE 2-28   IR KYPHO EA ADDL LEVEL THORACIC OR LUMBAR  11/16/2021   IR KYPHO LUMBAR INC FX REDUCE BONE BX UNI/BIL CANNULATION INC/IMAGING  02/08/2022   IR KYPHO THORACIC WITH BONE BIOPSY   11/16/2021   IR RADIOLOGIST EVAL & MGMT  11/07/2021   IR RADIOLOGIST EVAL & MGMT  02/01/2022   IR RADIOLOGIST EVAL & MGMT  02/27/2022   JOINT REPLACEMENT Left 2005   knee   MAXILLARY ANTROSTOMY Left 03/13/2017   Procedure: MAXILLARY ANTROSTOMY WITH TISSUE REMOVAL;  Surgeon: Milissa Hamming, MD;  Location: [facility];  Service: ENT;  Laterality: Left;    Allergies: Dapagliflozin, Keflex [cephalexin], and Clindamycin/lincomycin  Medications: Prior to Admission medications   Medication Sig Start Date End Date Taking? Authorizing Provider  acetaminophen  (TYLENOL ) 650 MG CR tablet Take 650 mg by mouth every 8 (eight) hours as needed.    [provider]  alendronate (FOSAMAX) 70 MG tablet Take 70 mg by mouth once a week. Take with a full glass of water on an empty stomach.    [provider]  clonazePAM (KLONOPIN) 0.5 MG tablet Take 0.5 mg by mouth 2 (two) times daily as needed for anxiety.    [provider]  DULoxetine (CYMBALTA) 20 MG capsule Take 40 mg by mouth daily. 08/10/22   [provider]  Exenatide ER (BYDUREON BCISE) 2 MG/0.85ML AUIJ Inject 2 mLs into the skin once a week.    [provider]  gabapentin (NEURONTIN) 600 MG tablet Take 600 mg by mouth 3 (three) times daily. 600 mg AM, 600 mg lunchtime, 1200 mg PM    [provider]  ibuprofen  (ADVIL ) 600 MG tablet Take 600 mg by mouth as needed.    [provider]  levothyroxine (SYNTHROID) 100 MCG tablet Take 100 mcg by mouth daily before breakfast.    [provider]  lisinopril (ZESTRIL) 10 MG tablet Take 10 mg by mouth daily.    [provider]  meclizine (ANTIVERT) 25 MG tablet Take 25 mg by mouth 3 (three) times daily as needed for dizziness.    [provider]  metFORMIN (GLUCOPHAGE) 1000 MG tablet Take 1,000 mg by mouth 2 (two) times daily with a meal.    [provider]  metoprolol succinate (TOPROL-XL) 25 MG 24 hr tablet  Take 25 mg by mouth daily.    [provider]  oxyCODONE  (ROXICODONE ) 5 MG immediate release tablet Take 0.5 tablets (2.5 mg total) by mouth every 6 (six) hours as needed for severe pain (pain score 7-10). 08/01/23 07/31/24  Arlander Charleston, MD  pramipexole (MIRAPEX) 0.25 MG tablet Take 0.25 mg by mouth daily. 08/10/22   [provider]  pregabalin (LYRICA) 50 MG capsule Take 50 mg by mouth 4 (four) times daily as needed.    [provider]     Family History  Problem Relation Age of Onset   Stroke Mother    Breast cancer Neg Hx     Social History   Socioeconomic History   Marital status: Widowed    Spouse name: Not on file   Number of children: Not on file   Years of education: Not on file   Highest education level:  Not on file  Occupational History   Not on file  Tobacco Use   Smoking status: Never   Smokeless tobacco: Never  Vaping Use   Vaping status: Never Used  Substance and Sexual Activity   Alcohol use: Yes   Drug use: No   Sexual activity: Not on file  Other Topics Concern   Not on file  Social History Narrative   Not on file   Social Drivers of Health   Financial Resource Strain: Low Risk  (04/22/2023)   Received from [facility]   Overall Financial Resource Strain (CARDIA)    Difficulty of Paying Living Expenses: Not hard at all  Food Insecurity: No Food Insecurity (04/22/2023)   Received from [facility]   Hunger Vital Sign    Within the past 12 months, you worried that your food would run out before you got the money to buy more.: Never true    Within the past 12 months, the food you bought just didn't last and you didn't have money to get more.: Never true  Transportation Needs: No Transportation Needs (04/22/2023)   Received from [facility]   PRAPARE - Transportation    Lack of Transportation (Non-Medical): No    In the past 12 months, has lack of transportation kept you from  medical appointments or from getting medications?: No  Physical Activity: Not on file  Stress: Not on file  Social Connections: Not on file     Review of Systems: A 12 point ROS discussed and pertinent positives are indicated in the HPI above.  All other systems are negative.  Review of Systems  Constitutional:  Negative for fatigue and fever.  Respiratory:  Negative for cough and shortness of breath.   Cardiovascular:  Negative for chest pain.  Gastrointestinal:  Negative for abdominal pain and nausea.  Musculoskeletal:  Positive for back pain.  Psychiatric/Behavioral:  Negative for behavioral problems and confusion.     Vital Signs: There were no vitals taken for this visit.  Physical Exam Vitals and nursing note reviewed.  Constitutional:      General: She is not in acute distress.    Appearance: Normal appearance.  Cardiovascular:     Rate and Rhythm: Normal rate.  Pulmonary:     Effort: Pulmonary effort is normal.  Musculoskeletal:        General: Tenderness present. Normal range of motion.     Comments: (+) Point tenderness along the spine in the mid back between the shoulder blades.  Increased pain with movement which migrates into the right ribs/shoulder blade.  (+) Palpable pain in the lumbar spine, generalized    Neurological:     Mental Status: She is alert.     Imaging: MR THORACIC SPINE WO CONTRAST Result Date: 09/26/2023 EXAM: MRI THORACIC AND LUMBAR SPINE WITHOUT INTRAVENOUS CONTRAST 09/26/2023 07:17:18 PM TECHNIQUE: Multiplanar multisequence MRI of the thoracic and lumbar spine was performed without the administration of intravenous contrast. COMPARISON: MRI thoracic and lumbar spine 12/09/2022 and CT chest 08/01/2023. CLINICAL HISTORY: Pt states fall one week ago and having back pain. FINDINGS: BONES AND ALIGNMENT: Severe compression fracture of L1 with approximately 9 mm retropulsion at the superior endplate of L1, resulting in spinal canal narrowing and  indentation of the ventral thoracic cord. Lumbar vertebral body heights are otherwise maintained. No significant bone marrow edema to suggest acute fracture. Similar grade 1 anterolisthesis of L4 on L5. Compression fractures of T5 and T6 status post  kyphoplasty. The degree of height loss at T5 appears slightly increased now with approximately 60% height loss centrally, previously 50%. The degree of height loss at T6 is similar to prior. There is no significant retropulsion. Additional compression fracture of T11 status post kyphoplasty with interval increase in height loss of the vertebral body anteriorly. There is now 65% height loss anteriorly, previously approximately 35% height loss. The degree of retropulsion at the T11 superior endplate appears slightly increased now, measuring up to 3 mm. There is diffuse edema throughout the T7 vertebral body extending into the pedicles, concerning for acute fracture. There is subtle irregularity of the endplates with approximately 10% height loss. No significant retropulsion. No additional edema or acute fracture elsewhere in the thoracic spine. Similar exaggerated thoracic kyphosis of the mid thoracic spine. SPINAL CORD: The conus medullae extends to the L2 level. SOFT TISSUES: Unremarkable. THORACIC DISC LEVELS: At T12-L1, there is a disc bulge with retropulsion of fracture fragments at L1 and impingement on the ventral thecal sac with flattening of the ventral cord. There is moderate to severe spinal canal stenosis. No significant foraminal stenosis. Shallow disc bulges at multiple levels in the thoracic spine without high-grade osseous spinal canal stenosis. Facet arthrosis at multiple levels. There is mild right and moderate left foraminal stenosis at T10-11 with some degree of contribution from retropulsion at the T11 superior endplate. LUMBAR DISC LEVELS: L1-L2: There is a small disc bulge with mild lateral recess narrowing and mild facet arthrosis. No significant  spinal canal or foraminal stenosis. L2-L3: There is a small disc bulge with mild lateral recess narrowing, mild facet arthrosis, and thickening of the ligamentum flavum. No significant spinal canal or foraminal stenosis. L3-L4: There is a small disc bulge, mild facet arthrosis, and thickening of the ligamentum flavum with mild lateral recess narrowing. There is no significant spinal canal stenosis. Mild bilateral foraminal stenosis. L4-L5: There is mild disc height loss, anterior listhesis with partial uncovering of the disc, and diffuse disc bulge eccentric to the left. There is lateral recess narrowing, greater on the left, moderate to severe facet arthrosis, and thickening of the ligamentum flavum. There is moderate spinal canal stenosis. There is mild right and moderate left foraminal stenosis. L5-S1: There is a small disc bulge, moderate facet arthrosis, and thickening of the ligamentum flavum. No significant spinal canal stenosis. Moderate right and mild left foraminal stenosis, increased on the right. IMPRESSION: 1. Acute T7 compression fracture with approximately 10% height loss. No significant retropulsion. 2. Chronic L1 compression fracture with retropulsion causing spinal canal narrowing and ventral cord indentation. Similar moderate-to-severe spinal canal stenosis at this level. 3. Compression fractures of T5, T6, and T11 status post kyphoplasty with interval increase in height loss at T5 and T11. Increased retropulsion at the T11 superior endplate resulting in mild spinal canal stenosis. Moderate foraminal narrowing on the left at T11 is increased from prior. 4. No acute fracture in the lumbar spine. 5. Degenerative changes as above. Moderate spinal canal stenosis at L4-L5. Moderate foraminal stenosis on the left at L4-5. Moderate foraminal stenosis on the right at L5-S1 is increased. Electronically signed by: Donnice Mania MD 09/26/2023 08:04 PM EDT RP Workstation: [id number]   MR LUMBAR SPINE WO  CONTRAST Result Date: 09/26/2023 EXAM: MRI THORACIC AND LUMBAR SPINE WITHOUT INTRAVENOUS CONTRAST 09/26/2023 07:17:18 PM TECHNIQUE: Multiplanar multisequence MRI of the thoracic and lumbar spine was performed without the administration of intravenous contrast. COMPARISON: MRI thoracic and lumbar spine 12/09/2022 and CT chest 08/01/2023. CLINICAL HISTORY:  Pt states fall one week ago and having back pain. FINDINGS: BONES AND ALIGNMENT: Severe compression fracture of L1 with approximately 9 mm retropulsion at the superior endplate of L1, resulting in spinal canal narrowing and indentation of the ventral thoracic cord. Lumbar vertebral body heights are otherwise maintained. No significant bone marrow edema to suggest acute fracture. Similar grade 1 anterolisthesis of L4 on L5. Compression fractures of T5 and T6 status post kyphoplasty. The degree of height loss at T5 appears slightly increased now with approximately 60% height loss centrally, previously 50%. The degree of height loss at T6 is similar to prior. There is no significant retropulsion. Additional compression fracture of T11 status post kyphoplasty with interval increase in height loss of the vertebral body anteriorly. There is now 65% height loss anteriorly, previously approximately 35% height loss. The degree of retropulsion at the T11 superior endplate appears slightly increased now, measuring up to 3 mm. There is diffuse edema throughout the T7 vertebral body extending into the pedicles, concerning for acute fracture. There is subtle irregularity of the endplates with approximately 10% height loss. No significant retropulsion. No additional edema or acute fracture elsewhere in the thoracic spine. Similar exaggerated thoracic kyphosis of the mid thoracic spine. SPINAL CORD: The conus medullae extends to the L2 level. SOFT TISSUES: Unremarkable. THORACIC DISC LEVELS: At T12-L1, there is a disc bulge with retropulsion of fracture fragments at L1 and  impingement on the ventral thecal sac with flattening of the ventral cord. There is moderate to severe spinal canal stenosis. No significant foraminal stenosis. Shallow disc bulges at multiple levels in the thoracic spine without high-grade osseous spinal canal stenosis. Facet arthrosis at multiple levels. There is mild right and moderate left foraminal stenosis at T10-11 with some degree of contribution from retropulsion at the T11 superior endplate. LUMBAR DISC LEVELS: L1-L2: There is a small disc bulge with mild lateral recess narrowing and mild facet arthrosis. No significant spinal canal or foraminal stenosis. L2-L3: There is a small disc bulge with mild lateral recess narrowing, mild facet arthrosis, and thickening of the ligamentum flavum. No significant spinal canal or foraminal stenosis. L3-L4: There is a small disc bulge, mild facet arthrosis, and thickening of the ligamentum flavum with mild lateral recess narrowing. There is no significant spinal canal stenosis. Mild bilateral foraminal stenosis. L4-L5: There is mild disc height loss, anterior listhesis with partial uncovering of the disc, and diffuse disc bulge eccentric to the left. There is lateral recess narrowing, greater on the left, moderate to severe facet arthrosis, and thickening of the ligamentum flavum. There is moderate spinal canal stenosis. There is mild right and moderate left foraminal stenosis. L5-S1: There is a small disc bulge, moderate facet arthrosis, and thickening of the ligamentum flavum. No significant spinal canal stenosis. Moderate right and mild left foraminal stenosis, increased on the right. IMPRESSION: 1. Acute T7 compression fracture with approximately 10% height loss. No significant retropulsion. 2. Chronic L1 compression fracture with retropulsion causing spinal canal narrowing and ventral cord indentation. Similar moderate-to-severe spinal canal stenosis at this level. 3. Compression fractures of T5, T6, and T11 status  post kyphoplasty with interval increase in height loss at T5 and T11. Increased retropulsion at the T11 superior endplate resulting in mild spinal canal stenosis. Moderate foraminal narrowing on the left at T11 is increased from prior. 4. No acute fracture in the lumbar spine. 5. Degenerative changes as above. Moderate spinal canal stenosis at L4-L5. Moderate foraminal stenosis on the left at L4-5. Moderate foraminal  stenosis on the right at L5-S1 is increased. Electronically signed by: Donnice Mania MD 09/26/2023 08:04 PM EDT RP Workstation: [id number]   US  Abdomen Limited RUQ (LIVER/GB) Result Date: 09/19/2023 EXAM: Right Upper Quadrant Abdominal Ultrasound TECHNIQUE: Real-time ultrasonography of the right upper quadrant of the abdomen was performed. COMPARISON: 10/22/2022 status post cholecystectomy. CLINICAL HISTORY: Elevated LFT, abdominal pain. FINDINGS: LIVER: The liver demonstrates normal echogenicity. No intrahepatic biliary ductal dilatation. The portal vein is patent with flow towards the liver. BILIARY SYSTEM: Status post cholecystectomy. The common bile duct measures 2 cm in diameter. On the comparison exam this measured 2.1 cm. OTHER: No right upper quadrant ascites. IMPRESSION: 1. Status post cholecystectomy. 2. Common bile duct measures 2 cm in diameter, slightly decreased from 2.1 cm on the prior study. No intrahepatic bile duct dilatation. In the absence of clinical signs or symptoms of biliary obstruction, this is favored to represent post-cholecystectomy physiology. If there are clinical signs or symptoms of bile duct obstruction, consider further evaluation with MRCP. Electronically signed by: Waddell Calk MD 09/19/2023 09:00 AM EDT RP Workstation: [id number]   DG Chest 2 View Result Date: 09/17/2023 CLINICAL DATA:  Fall, back pain EXAM: CHEST - 2 VIEW COMPARISON:  08/01/2023 chest x-ray and CT. FINDINGS: Low lung volumes. Areas of perihilar and lingular atelectasis. No effusions or  pneumothorax. No acute bony abnormality. 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 IMPRESSION: Low lung volumes with areas of atelectasis bilaterally. No acute cardiopulmonary disease. Electronically Signed   By: Franky Crease M.D.   On: 09/17/2023 18:42    Labs:  CBC: Recent Labs    08/01/23 1601 09/17/23 1858  WBC 6.4 4.3  HGB 12.6 11.8*  HCT 39.6 36.5  PLT 158 98*    COAGS: No results for input(s): INR, APTT in the last 8760 hours.  BMP: Recent Labs    08/01/23 1601 08/01/23 1903 09/17/23 1858  NA 138 137 141  K 6.1* 5.9* 6.1*  CL 105 104 108  CO2 24 24 24   GLUCOSE 103* 106* 104*  BUN 37* 35* 14  CALCIUM 9.4 9.5 9.7  CREATININE 1.00 1.00 0.92  GFRNONAA 58* 58* >60    LIVER FUNCTION TESTS: Recent Labs    09/17/23 1858  BILITOT 1.5*  AST 83*  ALT 100*  ALKPHOS 306*  PROT 7.5  ALBUMIN 4.0    TUMOR MARKERS: No results for input(s): AFPTM, CEA, CA199, CHROMGRNA in the last 8760 hours.  Assessment and Plan: Intractable back pain  Kelly Wells is a 78 year old female with history of diabetes, HTN, and compression fractures at T5, T6, T11 s/p kyphoplasty in 2023 and 2024. She presents for evaluation and management of her chronic L1 compression fracture with retropulsion and bony fragments not amenable to cement augmentation, as well as new T7 compression fracture which is appropriate for vertebroplasty/kyphoplasty.  Patient states her back pain has been poorly controlled since her most recent fall 3 weeks ago.  She is taking Tylenol  as able as she continues to manage her home, however has required 1-2 daily doses of narcotic pain medication due to severe pain achieving only moderate relief.  She is unable to perform her usual activities at home due to the pain which has been a significant change in her quality of life.  After discussion today she desires to proceed with T7 vertebroplasty/kyphoplasty as  well as ESI of L1 and L2.   Case to be scheduled at [facility] at  her convenience.  Thank you for this interesting consult.  I greatly enjoyed meeting Kelly Wells and look forward to participating in their care.  A copy of this report was sent to the requesting provider on this date.  Electronically Signed: Armanie Ullmer Sue-Ellen Christabell Loseke, PA 10/03/2023, 9:24 AM

## 2023-10-07 ENCOUNTER — Other Ambulatory Visit: Payer: Self-pay | Admitting: Interventional Radiology

## 2023-10-07 DIAGNOSIS — M8008XA Age-related osteoporosis with current pathological fracture, vertebra(e), initial encounter for fracture: Secondary | ICD-10-CM

## 2023-10-08 ENCOUNTER — Other Ambulatory Visit: Payer: Self-pay | Admitting: Interventional Radiology

## 2023-10-08 DIAGNOSIS — S32000A Wedge compression fracture of unspecified lumbar vertebra, initial encounter for closed fracture: Secondary | ICD-10-CM

## 2023-10-08 DIAGNOSIS — M8008XA Age-related osteoporosis with current pathological fracture, vertebra(e), initial encounter for fracture: Secondary | ICD-10-CM

## 2023-10-15 NOTE — Discharge Instructions (Signed)
 Post Procedure Spinal Discharge Instruction Sheet  You may resume a regular diet and any medications that you routinely take (including pain medications) unless otherwise noted by MD.  No driving day of procedure.  Light activity throughout the rest of the day.  Do not do any strenuous work, exercise, bending or lifting.  The day following the procedure, you can resume normal physical activity but you should refrain from exercising or physical therapy for at least three days thereafter.  You may apply ice to the injection site, 20 minutes on, 20 minutes off, as needed. Do not apply ice directly to skin.    Common Side Effects:  Headaches- take your usual medications as directed by your physician.  Increase your fluid intake.  Caffeinated beverages may be helpful.  Lie flat in bed until your headache resolves.  Restlessness or inability to sleep- you may have trouble sleeping for the next few days.  Ask your referring physician if you need any medication for sleep.  Facial flushing or redness- should subside within a few days.  Increased pain- a temporary increase in pain a day or two following your procedure is not unusual.  Take your pain medication as prescribed by your referring physician.  Leg cramps  Please contact our office at [phone] for the following symptoms: Fever greater than 100 degrees. Headaches unresolved with medication after 2-3 days. Increased swelling, pain, or redness at injection site.   Thank you for visiting [facility] today!   Kyphoplasty Post Procedure Discharge Instructions  May resume a regular diet and any medications that you routinely take (including pain medications). However, if you are taking Aspirin or an anticoagulant/blood thinner you will be told when you can resume taking these by the healthcare provider. No driving day of procedure. The day of your procedure take it easy. You may use an ice pack as needed to injection sites on back.   Ice to back 30 minutes on and 30 minutes off, as needed. May remove bandaids tomorrow after taking a shower. Replace daily with a clean bandaid until healed.  Do not lift anything heavier than a milk jug for 1-2 weeks or determined by your physician.  Follow up with your physician in 2 weeks.  If you need to speak to someone after hours, please call the on call IR physician at [phone].  Tell them you are a patient of Dr. Karalee and that you had a Kyphoplasty today and the issues you are having.   Please contact our office at [phone] for the following symptoms or if you have any questions:  Fever greater than 100 degrees Increased swelling, pain, or redness at injection site. Increased back and/or leg pain New numbness or change in symptoms from before the procedure.    Thank you for visiting [facility]!

## 2023-10-16 ENCOUNTER — Telehealth: Payer: Self-pay

## 2023-10-16 ENCOUNTER — Ambulatory Visit: Payer: Medicare Other | Admitting: Neurosurgery

## 2023-10-16 NOTE — Discharge Instructions (Signed)

## 2023-10-17 ENCOUNTER — Inpatient Hospital Stay
Admission: RE | Admit: 2023-10-17 | Discharge: 2023-10-17 | Disposition: A | Source: Ambulatory Visit | Attending: Interventional Radiology | Admitting: Interventional Radiology

## 2023-10-17 ENCOUNTER — Ambulatory Visit: Payer: Medicare Other | Admitting: Neurosurgery

## 2023-10-17 ENCOUNTER — Ambulatory Visit
Admission: RE | Admit: 2023-10-17 | Discharge: 2023-10-17 | Disposition: A | Discharge status: Home / Self Care | Source: Ambulatory Visit | Attending: Interventional Radiology | Admitting: Interventional Radiology

## 2023-10-17 DIAGNOSIS — S32000A Wedge compression fracture of unspecified lumbar vertebra, initial encounter for closed fracture: Secondary | ICD-10-CM

## 2023-10-17 DIAGNOSIS — M8008XA Age-related osteoporosis with current pathological fracture, vertebra(e), initial encounter for fracture: Secondary | ICD-10-CM

## 2023-10-17 HISTORY — PX: IR KYPHO THORACIC WITH BONE BIOPSY: IMG5518

## 2023-10-17 MED ORDER — ACETAMINOPHEN 10 MG/ML IV SOLN
1000.0000 mg | Freq: Once | INTRAVENOUS | Status: AC
Start: 2023-10-17 — End: 2023-10-17
  Administered 2023-10-17: 1000 mg via INTRAVENOUS

## 2023-10-17 MED ORDER — FENTANYL CITRATE (PF) 100 MCG/2ML IJ SOLN
INTRAMUSCULAR | Status: DC | PRN
Start: 2023-10-17 — End: 2023-10-18
  Administered 2023-10-17 (×3): 50 ug via INTRAVENOUS

## 2023-10-17 MED ORDER — LORAZEPAM 2 MG/ML IJ SOLN: INTRAMUSCULAR | Status: DC | PRN

## 2023-10-17 MED ORDER — MIDAZOLAM HCL 2 MG/2ML IJ SOLN
INTRAMUSCULAR | Status: DC | PRN
  Administered 2023-10-17 (×4): 1 mg via INTRAVENOUS

## 2023-10-17 MED ORDER — METHYLPREDNISOLONE ACETATE 40 MG/ML INJ SUSP (RADIOLOG
80.0000 mg | Freq: Once | INTRAMUSCULAR | Status: AC
  Administered 2023-10-17: 80 mg via EPIDURAL

## 2023-10-17 MED ORDER — VANCOMYCIN HCL IN DEXTROSE 1-5 GM/200ML-% IV SOLN
1000.0000 mg | INTRAVENOUS | Status: AC
  Administered 2023-10-17: 1000 mg via INTRAVENOUS

## 2023-10-17 MED ORDER — MIDAZOLAM HCL 2 MG/2ML IJ SOLN: 1.0000 mg | INTRAMUSCULAR | Status: DC | PRN

## 2023-10-17 MED ORDER — FENTANYL CITRATE (PF) 50 MCG/ML IJ SOSY: 25.0000 ug | PREFILLED_SYRINGE | INTRAMUSCULAR | Status: DC | PRN

## 2023-10-17 MED ORDER — LIDOCAINE HCL (PF) 1 % IJ SOLN
10.0000 mL | Freq: Once | INTRAMUSCULAR | Status: AC
  Administered 2023-10-17: 10 mL via INTRADERMAL

## 2023-10-17 MED ORDER — SODIUM CHLORIDE 0.9 % IV SOLN: INTRAVENOUS | Status: DC

## 2023-10-17 MED ORDER — IOPAMIDOL (ISOVUE-200) INJECTION 41%
3.0000 mL | Freq: Once | INTRAVENOUS | Status: AC | PRN
  Administered 2023-10-17: 3 mL

## 2023-10-23 NOTE — Addendum Note (Signed)
 Encounter addended by: Jarold Nest on: 10/23/2023 8:39 AM  Actions taken: Imaging Exam ended
